# Patient Record
Sex: Female | Born: 1995 | Race: Black or African American | Hispanic: No | Marital: Single
Health system: Southern US, Community
[De-identification: ages and names within clinical notes are randomized; demographics above are authoritative.]

## PROBLEM LIST (undated history)

## (undated) DIAGNOSIS — R011 Cardiac murmur, unspecified: Secondary | ICD-10-CM

---
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
## 2014-11-18 ENCOUNTER — Emergency Department (HOSPITAL_COMMUNITY)
Admission: EM | Admit: 2014-11-18 | Discharge: 2014-11-18 | Disposition: A | Discharge status: Home / Self Care | Payer: Medicaid Other | Attending: Emergency Medicine | Admitting: Emergency Medicine

## 2014-11-18 ENCOUNTER — Encounter (HOSPITAL_COMMUNITY): Payer: Self-pay

## 2014-11-18 DIAGNOSIS — Z79899 Other long term (current) drug therapy: Secondary | ICD-10-CM | POA: Diagnosis not present

## 2014-11-18 DIAGNOSIS — N760 Acute vaginitis: Secondary | ICD-10-CM | POA: Insufficient documentation

## 2014-11-18 DIAGNOSIS — R011 Cardiac murmur, unspecified: Secondary | ICD-10-CM | POA: Insufficient documentation

## 2014-11-18 DIAGNOSIS — Z3202 Encounter for pregnancy test, result negative: Secondary | ICD-10-CM | POA: Insufficient documentation

## 2014-11-18 DIAGNOSIS — B9689 Other specified bacterial agents as the cause of diseases classified elsewhere: Secondary | ICD-10-CM

## 2014-11-18 LAB — WET PREP, GENITAL
Clue Cells Wet Prep HPF POC: NONE SEEN
Trich, Wet Prep: NONE SEEN
WBC, Wet Prep HPF POC: NONE SEEN
YEAST WET PREP: NONE SEEN

## 2014-11-18 LAB — PREGNANCY, URINE: Preg Test, Ur: NEGATIVE

## 2014-11-18 MED ORDER — METRONIDAZOLE 500 MG PO TABS: 500.0000 mg | ORAL_TABLET | Freq: Two times a day (BID) | ORAL | Status: DC

## 2014-11-18 MED ORDER — HYDROCODONE-ACETAMINOPHEN 5-325 MG PO TABS: 2.0000 | ORAL_TABLET | ORAL | Status: DC | PRN

## 2014-11-18 NOTE — ED Notes (Signed)
Pt has been having vaginal discharge and RLQ for a few months. Was seen after having bleeding with birth control but was told that they couldn't see anything on the microscope. States she has an odor down there and with her urine.

## 2014-11-18 NOTE — Discharge Instructions (Signed)
Bacterial Vaginosis Bacterial vaginosis is a vaginal infection that occurs when the normal balance of bacteria in the vagina is disrupted. It results from an overgrowth of certain bacteria. This is the most common vaginal infection in women of childbearing age. Treatment is important to prevent complications, especially in pregnant women, as it can cause a premature delivery. CAUSES  Bacterial vaginosis is caused by an increase in harmful bacteria that are normally present in smaller amounts in the vagina. Several different kinds of bacteria can cause bacterial vaginosis. However, the reason that the condition develops is not fully understood. RISK FACTORS Certain activities or behaviors can put you at an increased risk of developing bacterial vaginosis, including:  Having a new sex partner or multiple sex partners.  Douching.  Using an intrauterine device (IUD) for contraception. Women do not get bacterial vaginosis from toilet seats, bedding, swimming pools, or contact with objects around them. SIGNS AND SYMPTOMS  Some women with bacterial vaginosis have no signs or symptoms. Common symptoms include:  Grey vaginal discharge.  A fishlike odor with discharge, especially after sexual intercourse.  Itching or burning of the vagina and vulva.  Burning or pain with urination. DIAGNOSIS  Your health care provider will take a medical history and examine the vagina for signs of bacterial vaginosis. A sample of vaginal fluid may be taken. Your health care provider will look at this sample under a microscope to check for bacteria and abnormal cells. A vaginal pH test may also be done.  TREATMENT  Bacterial vaginosis may be treated with antibiotic medicines. These may be given in the form of a pill or a vaginal cream. A second round of antibiotics may be prescribed if the condition comes back after treatment.  HOME CARE INSTRUCTIONS   Only take over-the-counter or prescription medicines as  directed by your health care provider.  If antibiotic medicine was prescribed, take it as directed. Make sure you finish it even if you start to feel better.  Do not have sex until treatment is completed.  Tell all sexual partners that you have a vaginal infection. They should see their health care provider and be treated if they have problems, such as a mild rash or itching.  Practice safe sex by using condoms and only having one sex partner. SEEK MEDICAL CARE IF:   Your symptoms are not improving after 3 days of treatment.  You have increased discharge or pain.  You have a fever. MAKE SURE YOU:   Understand these instructions.  Will watch your condition.  Will get help right away if you are not doing well or get worse. FOR MORE INFORMATION  Centers for Disease Control and Prevention, Division of STD Prevention: www.cdc.gov/std American Sexual Health Association (ASHA): www.ashastd.org  Document Released: 07/19/2005 Document Revised: 05/09/2013 Document Reviewed: 02/28/2013 [employer] Patient Information 2015 [employer]. This information is not intended to replace advice given to you by your health care provider. Make sure you discuss any questions you have with your health care provider.  

## 2014-11-18 NOTE — ED Provider Notes (Signed)
CSN: [id number]     Arrival date & time 11/18/14  1155 History   First MD Initiated Contact with Patient 11/18/14 1547     Chief Complaint  Patient presents with  . Vaginal Discharge     (Consider location/radiation/quality/duration/timing/severity/associated sxs/prior Treatment) Patient is a 19 y.o. female presenting with vaginal discharge. The history is provided by the patient. No language interpreter was used.  Vaginal Discharge Quality:  White Severity:  Moderate Onset quality:  Gradual Timing:  Constant Progression:  Worsening Chronicity:  New Context: after urination   Relieved by:  Nothing Ineffective treatments:  None tried Associated symptoms: no abdominal pain   Risk factors: no new sexual partner and no PID     Past Medical History  Diagnosis Date  . Heart murmur    History reviewed. No pertinent past surgical history. No family history on file. History  Substance Use Topics  . Smoking status: Passive Smoke Exposure - Never Smoker  . Smokeless tobacco: Not on file  . Alcohol Use: No   OB History    No data available     Review of Systems  Gastrointestinal: Negative for abdominal pain.  Genitourinary: Positive for vaginal discharge and vaginal pain.  All other systems reviewed and are negative.     Allergies  Review of patient's allergies indicates no known allergies.  Home Medications   Prior to Admission medications   Medication Sig Start Date End Date Taking? Authorizing Provider  calcium carbonate (TUMS EX) 750 MG chewable tablet Chew 1 tablet by mouth daily.   Yes Historical Provider, MD  CRANBERRY PO Take 1 tablet by mouth daily.   Yes Historical Provider, MD  ferrous fumarate (HEMOCYTE - 106 MG FE) 325 (106 FE) MG TABS tablet Take 1 tablet by mouth daily.   Yes Historical Provider, MD  medroxyPROGESTERone (DEPO-PROVERA) 150 MG/ML injection Inject 150 mg into the muscle every 3 (three) months.   Yes Historical Provider, MD  Multiple  Vitamins-Minerals (WOMENS HAIR, SKIN & NAILS PO) Take 1 tablet by mouth daily.   Yes Historical Provider, MD  lidocaine (XYLOCAINE) 2 % solution Use as directed 20 mLs in the mouth or throat as needed for mouth pain. Patient not taking: Reported on 11/18/2014 09/19/13   Francee PiccoloJennifer Piepenbrink, PA-C  metroNIDAZOLE (FLAGYL) 500 MG tablet Take 1 tablet (500 mg total) by mouth 2 (two) times daily. Patient not taking: Reported on 11/18/2014 09/19/13   Victorino DikeJennifer Piepenbrink, PA-C   BP 109/56 mmHg  Pulse 65  Temp(Src) 98.8 F (37.1 C) (Oral)  Resp 18  Ht 5\' 5"  (1.651 m)  Wt 116 lb (52.617 kg)  BMI 19.30 kg/m2  SpO2 100% Physical Exam  Constitutional: She appears well-developed and well-nourished.  HENT:  Head: Normocephalic.  Right Ear: External ear normal.  Left Ear: External ear normal.  Nose: Nose normal.  Mouth/Throat: Oropharynx is clear and moist.  Eyes: Conjunctivae are normal. Pupils are equal, round, and reactive to light.  Neck: Normal range of motion. Neck supple.  Cardiovascular: Normal rate and normal heart sounds.   Pulmonary/Chest: Effort normal and breath sounds normal.  Abdominal: Soft.  Genitourinary: Vaginal discharge found.  Thick white discharge.  Vaginal odor.  Adnexa nontender,  No mass.  Cervix nontender  Musculoskeletal: Normal range of motion.  Neurological: She is alert.  Skin: Skin is warm.  Psychiatric: She has a normal mood and affect.  Nursing note and vitals reviewed.   ED Course  Procedures (including critical care time) Labs Review Labs Reviewed  WET PREP, GENITAL  PREGNANCY, URINE  GC/CHLAMYDIA PROBE AMP ([place])    Imaging Review No results found.   EKG Interpretation None      MDM urine preg is negative,    Wet prep is completely negative  (I doubt this result, diluted, mistake...)  I feel clinically pt has a vaginitis,  It does not appear to be yeast.  As pt has vaginal discomfort, odor and discharge I am going to treat for BV.     Final diagnoses:  BV (bacterial vaginosis)    Flagyl  Schedule to see your Physician for recheck     Elson Areas, PA-C 11/18/14 1729  Eber Hong, MD 11/19/14 [phone]

## 2014-11-19 LAB — GC/CHLAMYDIA PROBE AMP (~~LOC~~) NOT AT ARMC
Chlamydia: NEGATIVE
NEISSERIA GONORRHEA: NEGATIVE

## 2015-01-30 ENCOUNTER — Encounter (HOSPITAL_COMMUNITY): Payer: Self-pay | Admitting: Emergency Medicine

## 2015-01-30 ENCOUNTER — Emergency Department (INDEPENDENT_AMBULATORY_CARE_PROVIDER_SITE_OTHER)
Admission: EM | Admit: 2015-01-30 | Discharge: 2015-01-30 | Disposition: A | Discharge status: Home / Self Care | Payer: Medicaid Other | Source: Home / Self Care | Attending: Family Medicine | Admitting: Family Medicine

## 2015-01-30 DIAGNOSIS — J029 Acute pharyngitis, unspecified: Secondary | ICD-10-CM

## 2015-01-30 LAB — POCT RAPID STREP A: STREPTOCOCCUS, GROUP A SCREEN (DIRECT): NEGATIVE

## 2015-01-30 MED ORDER — NAPROXEN 375 MG PO TABS: 375.0000 mg | ORAL_TABLET | Freq: Two times a day (BID) | ORAL | Status: DC

## 2015-01-30 NOTE — Discharge Instructions (Signed)
Thank you for coming in today. °Call or go to the emergency room if you get worse, have trouble breathing, have chest pains, or palpitations.  ° °Pharyngitis °Pharyngitis is redness, pain, and swelling (inflammation) of your pharynx.  °CAUSES  °Pharyngitis is usually caused by infection. Most of the time, these infections are from viruses (viral) and are part of a cold. However, sometimes pharyngitis is caused by bacteria (bacterial). Pharyngitis can also be caused by allergies. Viral pharyngitis may be spread from person to person by coughing, sneezing, and personal items or utensils (cups, forks, spoons, toothbrushes). Bacterial pharyngitis may be spread from person to person by more intimate contact, such as kissing.  °SIGNS AND SYMPTOMS  °Symptoms of pharyngitis include:   °· Sore throat.   °· Tiredness (fatigue).   °· Low-grade fever.   °· Headache. °· Joint pain and muscle aches. °· Skin rashes. °· Swollen lymph nodes. °· Plaque-like film on throat or tonsils (often seen with bacterial pharyngitis). °DIAGNOSIS  °Your health care provider will ask you questions about your illness and your symptoms. Your medical history, along with a physical exam, is often all that is needed to diagnose pharyngitis. Sometimes, a rapid strep test is done. Other lab tests may also be done, depending on the suspected cause.  °TREATMENT  °Viral pharyngitis will usually get better in 3-4 days without the use of medicine. Bacterial pharyngitis is treated with medicines that kill germs (antibiotics).  °HOME CARE INSTRUCTIONS  °· Drink enough water and fluids to keep your urine clear or pale yellow.   °· Only take over-the-counter or prescription medicines as directed by your health care provider:   °¨ If you are prescribed antibiotics, make sure you finish them even if you start to feel better.   °¨ Do not take aspirin.   °· Get lots of rest.   °· Gargle with 8 oz of salt water (½ tsp of salt per 1 qt of water) as often as every 1-2  hours to soothe your throat.   °· Throat lozenges (if you are not at risk for choking) or sprays may be used to soothe your throat. °SEEK MEDICAL CARE IF:  °· You have large, tender lumps in your neck. °· You have a rash. °· You cough up green, yellow-brown, or bloody spit. °SEEK IMMEDIATE MEDICAL CARE IF:  °· Your neck becomes stiff. °· You drool or are unable to swallow liquids. °· You vomit or are unable to keep medicines or liquids down. °· You have severe pain that does not go away with the use of recommended medicines. °· You have trouble breathing (not caused by a stuffy nose). °MAKE SURE YOU:  °· Understand these instructions. °· Will watch your condition. °· Will get help right away if you are not doing well or get worse. °Document Released: 07/19/2005 Document Revised: 05/09/2013 Document Reviewed: 03/26/2013 °ExitCare® Patient Information ©2015 [employer]. This information is not intended to replace advice given to you by your health care provider. Make sure you discuss any questions you have with your health care provider. ° ° ° °

## 2015-01-30 NOTE — ED Notes (Signed)
C/o sore throat States she has right ear pain Used cold and hot drinks as tx

## 2015-01-30 NOTE — ED Provider Notes (Signed)
Hewitt Shortsysheanna Starace is a 19 y.o. female who presents to Urgent Care today for sore throat with right-sided ear pain present for 3 days worsening today. No fevers chills nausea vomiting or diarrhea. She has not tried any medications yet. She feels well otherwise.   Past Medical History  Diagnosis Date  . Heart murmur    History reviewed. No pertinent past surgical history. History  Substance Use Topics  . Smoking status: Passive Smoke Exposure - Never Smoker  . Smokeless tobacco: Not on file  . Alcohol Use: No   ROS as above Medications: No current facility-administered medications for this encounter.   Current Outpatient Prescriptions  Medication Sig Dispense Refill  . calcium carbonate (TUMS EX) 750 MG chewable tablet Chew 1 tablet by mouth daily.    . ferrous fumarate (HEMOCYTE - 106 MG FE) 325 (106 FE) MG TABS tablet Take 1 tablet by mouth daily.    . medroxyPROGESTERone (DEPO-PROVERA) 150 MG/ML injection Inject 150 mg into the muscle every 3 (three) months.    . Multiple Vitamins-Minerals (WOMENS HAIR, SKIN & NAILS PO) Take 1 tablet by mouth daily.    . naproxen (NAPROSYN) 375 MG tablet Take 1 tablet (375 mg total) by mouth 2 (two) times daily. 20 tablet 0   No Known Allergies   Exam:  BP 123/74 mmHg  Pulse 76  Temp(Src) 98 F (36.7 C) (Oral)  Resp 16  SpO2 100% Gen: Well NAD HEENT: EOMI,  MMM normal tympanic membranes bilaterally. Posterior fractures mildly erythematous. Cervical lymphadenopathy is present bilaterally. Lungs: Normal work of breathing. CTABL Heart: RRR no MRG Abd: NABS, Soft. Nondistended, Nontender Exts: Brisk capillary refill, warm and well perfused.   Results for orders placed or performed during the hospital encounter of 01/30/15 (from the past 24 hour(s))  POCT rapid strep A [facility]     Status: None   Collection Time: 01/30/15  6:15 PM  Result Value Ref Range   Streptococcus, Group A Screen (Direct) NEGATIVE NEGATIVE   No results  found.  Assessment and Plan: 19 y.o. female with pharyngitis likely viral. Naproxen for pain. Culture pending. Return as needed.  Discussed warning signs or symptoms. Please see discharge instructions. Patient expresses understanding.     Rodolph BongEvan S Dyland Panuco, MD 01/30/15 Rickey [id number]

## 2015-02-01 LAB — CULTURE, GROUP A STREP: STREP A CULTURE: NEGATIVE

## 2015-10-12 ENCOUNTER — Encounter (HOSPITAL_COMMUNITY): Payer: Self-pay | Admitting: Emergency Medicine

## 2015-10-12 ENCOUNTER — Other Ambulatory Visit (HOSPITAL_COMMUNITY)
Admission: RE | Admit: 2015-10-12 | Discharge: 2015-10-12 | Disposition: A | Discharge status: Home / Self Care | Payer: Medicaid Other | Source: Ambulatory Visit | Attending: Emergency Medicine | Admitting: Emergency Medicine

## 2015-10-12 ENCOUNTER — Emergency Department (INDEPENDENT_AMBULATORY_CARE_PROVIDER_SITE_OTHER)
Admission: EM | Admit: 2015-10-12 | Discharge: 2015-10-12 | Disposition: A | Discharge status: Home / Self Care | Payer: Medicaid Other | Source: Home / Self Care | Attending: Emergency Medicine | Admitting: Emergency Medicine

## 2015-10-12 DIAGNOSIS — J039 Acute tonsillitis, unspecified: Secondary | ICD-10-CM | POA: Diagnosis not present

## 2015-10-12 DIAGNOSIS — N760 Acute vaginitis: Secondary | ICD-10-CM | POA: Diagnosis present

## 2015-10-12 DIAGNOSIS — Z113 Encounter for screening for infections with a predominantly sexual mode of transmission: Secondary | ICD-10-CM | POA: Diagnosis present

## 2015-10-12 DIAGNOSIS — J029 Acute pharyngitis, unspecified: Secondary | ICD-10-CM | POA: Diagnosis not present

## 2015-10-12 DIAGNOSIS — Z8742 Personal history of other diseases of the female genital tract: Secondary | ICD-10-CM

## 2015-10-12 LAB — POCT URINALYSIS DIP (DEVICE)
BILIRUBIN URINE: NEGATIVE
Glucose, UA: NEGATIVE mg/dL
Ketones, ur: NEGATIVE mg/dL
LEUKOCYTES UA: NEGATIVE
Nitrite: NEGATIVE
Protein, ur: NEGATIVE mg/dL
Specific Gravity, Urine: 1.02 (ref 1.005–1.030)
Urobilinogen, UA: 2 mg/dL — ABNORMAL HIGH (ref 0.0–1.0)
pH: 7 (ref 5.0–8.0)

## 2015-10-12 LAB — POCT PREGNANCY, URINE: Preg Test, Ur: NEGATIVE

## 2015-10-12 LAB — POCT RAPID STREP A: STREPTOCOCCUS, GROUP A SCREEN (DIRECT): NEGATIVE

## 2015-10-12 NOTE — Discharge Instructions (Signed)
Pharyngitis Taken antihistamine daily such as Allegra, Zyrtec or Claritin. If this is insufficient and you need something stronger he may add Chlor-Trimeton 2-4 mg every 4 hours. This may cause drowsiness set you may want to limit taking this at home or before bedtime. Ibuprofen 600 mg every 6-8 hours as needed for throat pain and inflammation. Drink plenty of clear liquids and stay well-hydrated. Cepacol lozenges for sore throat discomfort Pharyngitis is redness, pain, and swelling (inflammation) of your pharynx.  CAUSES  Pharyngitis is usually caused by infection. Most of the time, these infections are from viruses (viral) and are part of a cold. However, sometimes pharyngitis is caused by bacteria (bacterial). Pharyngitis can also be caused by allergies. Viral pharyngitis may be spread from person to person by coughing, sneezing, and personal items or utensils (cups, forks, spoons, toothbrushes). Bacterial pharyngitis may be spread from person to person by more intimate contact, such as kissing.  SIGNS AND SYMPTOMS  Symptoms of pharyngitis include:   Sore throat.   Tiredness (fatigue).   Low-grade fever.   Headache.  Joint pain and muscle aches.  Skin rashes.  Swollen lymph nodes.  Plaque-like film on throat or tonsils (often seen with bacterial pharyngitis). DIAGNOSIS  Your health care provider will ask you questions about your illness and your symptoms. Your medical history, along with a physical exam, is often all that is needed to diagnose pharyngitis. Sometimes, a rapid strep test is done. Other lab tests may also be done, depending on the suspected cause.  TREATMENT  Viral pharyngitis will usually get better in 3-4 days without the use of medicine. Bacterial pharyngitis is treated with medicines that kill germs (antibiotics).  HOME CARE INSTRUCTIONS   Drink enough water and fluids to keep your urine clear or pale yellow.   Only take over-the-counter or prescription  medicines as directed by your health care provider:   If you are prescribed antibiotics, make sure you finish them even if you start to feel better.   Do not take aspirin.   Get lots of rest.   Gargle with 8 oz of salt water ( tsp of salt per 1 qt of water) as often as every 1-2 hours to soothe your throat.   Throat lozenges (if you are not at risk for choking) or sprays may be used to soothe your throat. SEEK MEDICAL CARE IF:   You have large, tender lumps in your neck.  You have a rash.  You cough up green, yellow-brown, or bloody spit. SEEK IMMEDIATE MEDICAL CARE IF:   Your neck becomes stiff.  You drool or are unable to swallow liquids.  You vomit or are unable to keep medicines or liquids down.  You have severe pain that does not go away with the use of recommended medicines.  You have trouble breathing (not caused by a stuffy nose). MAKE SURE YOU:   Understand these instructions.  Will watch your condition.  Will get help right away if you are not doing well or get worse.   This information is not intended to replace advice given to you by your health care provider. Make sure you discuss any questions you have with your health care provider.   Document Released: 07/19/2005 Document Revised: 05/09/2013 Document Reviewed: 03/26/2013 Elsevier Interactive Patient Education 2016 [employer]  Sore Throat A sore throat is pain, burning, irritation, or scratchiness of the throat. There is often pain or tenderness when swallowing or talking. A sore throat may be accompanied by other symptoms,  such as coughing, sneezing, fever, and swollen neck glands. A sore throat is often the first sign of another sickness, such as a cold, flu, strep throat, or mononucleosis (commonly known as mono). Most sore throats go away without medical treatment. CAUSES  The most common causes of a sore throat include:  A viral infection, such as a cold, flu, or mono.  A bacterial  infection, such as strep throat, tonsillitis, or whooping cough.  Seasonal allergies.  Dryness in the air.  Irritants, such as smoke or pollution.  Gastroesophageal reflux disease (GERD). HOME CARE INSTRUCTIONS   Only take over-the-counter medicines as directed by your caregiver.  Drink enough fluids to keep your urine clear or pale yellow.  Rest as needed.  Try using throat sprays, lozenges, or sucking on hard candy to ease any pain (if older than 4 years or as directed).  Sip warm liquids, such as broth, herbal tea, or warm water with honey to relieve pain temporarily. You may also eat or drink cold or frozen liquids such as frozen ice pops.  Gargle with salt water (mix 1 tsp salt with 8 oz of water).  Do not smoke and avoid secondhand smoke.  Put a cool-mist humidifier in your bedroom at night to moisten the air. You can also turn on a hot shower and sit in the bathroom with the door closed for 5-10 minutes. SEEK IMMEDIATE MEDICAL CARE IF:  You have difficulty breathing.  You are unable to swallow fluids, soft foods, or your saliva.  You have increased swelling in the throat.  Your sore throat does not get better in 7 days.  You have nausea and vomiting.  You have a fever or persistent symptoms for more than 2-3 days.  You have a fever and your symptoms suddenly get worse. MAKE SURE YOU:   Understand these instructions.  Will watch your condition.  Will get help right away if you are not doing well or get worse.   This information is not intended to replace advice given to you by your health care provider. Make sure you discuss any questions you have with your health care provider.   Document Released: 08/26/2004 Document Revised: 08/09/2014 Document Reviewed: 03/26/2012 Elsevier Interactive Patient Education 2016 Else[employer] Inc[employer]  Tonsillitis Tonsillitis is an infection of the throat. This infection causes the tonsils to become red, tender, and puffy  (swollen). Tonsils are groups of tissue at the back of your throat. If bacteria caused your infection, antibiotic medicine will be given to you. Sometimes symptoms of tonsillitis can be relieved with the use of steroid medicine. If your tonsillitis is severe and happens often, you may need to get your tonsils removed (tonsillectomy). HOME CARE   Rest and sleep often.  Drink enough fluids to keep your pee (urine) clear or pale yellow.  While your throat is sore, eat soft or liquid foods like:  Soup.  Ice cream.  Instant breakfast drinks.  Eat frozen ice pops.  Gargle with a warm or cold liquid to help soothe the throat. Gargle with a water and salt mix. Mix 1/4 teaspoon of salt and 1/4 teaspoon of baking soda in 1 cup of water.  Only take medicines as told by your doctor.  If you are given medicines (antibiotics), take them as told. Finish them even if you start to feel better. GET HELP IF:  You have large, tender lumps in your neck.  You have a rash.  You cough up green, yellow-brown, or bloody fluid.  You cannot swallow liquids or food for 24 hours.  You notice that only one of your tonsils is swollen. GET HELP RIGHT AWAY IF:   You throw up (vomit).  You have a very bad headache.  You have a stiff neck.  You have chest pain.  You have trouble breathing or swallowing.  You have bad throat pain, drooling, or your voice changes.  You have bad pain not helped by medicine.  You cannot fully open your mouth.  You have redness, puffiness, or bad pain in the neck.  You have a fever. MAKE SURE YOU:   Understand these instructions.  Will watch your condition.  Will get help right away if you are not doing well or get worse.   This information is not intended to replace advice given to you by your health care provider. Make sure you discuss any questions you have with your health care provider.   Document Released: 01/05/2008 Document Revised: 07/24/2013 Document  Reviewed: 01/05/2013 Elsevier Interactive Patient Education [employer]

## 2015-10-12 NOTE — ED Provider Notes (Signed)
CSN: [id number]     Arrival date & time 10/12/15  1620 History   First MD Initiated Contact with Patient 10/12/15 1749     No chief complaint on file.  (Consider location/radiation/quality/duration/timing/severity/associated sxs/prior Treatment) HPI Comments: 20 year old female states that she feels as though her tonsils have been swelling off and on this week. Is cared her thinking that her airway was off. She also complains of sore throat with the right side greater than the left.  Second concern is that of a vaginal discharge for 3 months.   Past Medical History  Diagnosis Date  . Heart murmur    History reviewed. No pertinent past surgical history. No family history on file. Social History  Substance Use Topics  . Smoking status: Passive Smoke Exposure - Never Smoker  . Smokeless tobacco: None  . Alcohol Use: No   OB History    No data available     Review of Systems  Constitutional: Positive for activity change. Negative for fever, chills, appetite change and fatigue.  HENT: Positive for congestion, postnasal drip, sore throat, trouble swallowing and voice change. Negative for facial swelling.   Eyes: Negative.   Respiratory: Negative.  Negative for shortness of breath.   Cardiovascular: Negative.   Gastrointestinal: Negative.   Genitourinary: Positive for vaginal discharge. Negative for dysuria, frequency, flank pain, vaginal pain, menstrual problem and pelvic pain.  Musculoskeletal: Negative.  Negative for neck pain and neck stiffness.  Skin: Negative for pallor and rash.  Neurological: Negative.   All other systems reviewed and are negative.   Allergies  Review of patient's allergies indicates no known allergies.  Home Medications   Prior to Admission medications   Medication Sig Start Date End Date Taking? Authorizing Provider  ferrous fumarate (HEMOCYTE - 106 MG FE) 325 (106 FE) MG TABS tablet Take 1 tablet by mouth daily.   Yes Historical Provider, MD   Multiple Vitamins-Minerals (WOMENS HAIR, SKIN & NAILS PO) Take 1 tablet by mouth daily.   Yes Historical Provider, MD  calcium carbonate (TUMS EX) 750 MG chewable tablet Chew 1 tablet by mouth daily.    Historical Provider, MD  medroxyPROGESTERone (DEPO-PROVERA) 150 MG/ML injection Inject 150 mg into the muscle every 3 (three) months.    Historical Provider, MD  naproxen (NAPROSYN) 375 MG tablet Take 1 tablet (375 mg total) by mouth 2 (two) times daily. 01/30/15   Rodolph BongEvan S Corey, MD   Meds Ordered and Administered this Visit  Medications - No data to display  BP 119/73 mmHg  Pulse 83  Temp(Src) 98.4 F (36.9 C) (Oral)  SpO2 98% No data found.   Physical Exam  Constitutional: She is oriented to person, place, and time. She appears well-developed and well-nourished. No distress.  HENT:  Mouth/Throat: No oropharyngeal exudate.  Bilateral TMs are normal Oropharynx with mildly enlarged tonsils, minor erythema. Not cryptic. No exudates. Airway is widely patent. Posterior pharynx with light erythema, cobblestoning and clear PND.  Eyes: Conjunctivae and EOM are normal.  Neck: Normal range of motion. Neck supple.  Cardiovascular: Normal rate, regular rhythm, normal heart sounds and intact distal pulses.   Pulmonary/Chest: Effort normal and breath sounds normal. No respiratory distress. She has no wheezes. She has no rales.  Genitourinary:  Normal external female genitalia. Vaginal walls without discharge. Cervix is midline. Os is closed. Ectocervix is smooth and pink. No lesions. No discharge or other fluid within the vaginal vault and covering the cervix. No odor.   Musculoskeletal: Normal range of  motion. She exhibits no edema.  Lymphadenopathy:    She has no cervical adenopathy.  Neurological: She is alert and oriented to person, place, and time. She exhibits normal muscle tone.  Skin: Skin is warm and dry. No rash noted.  Psychiatric: She has a normal mood and affect.  Nursing note and  vitals reviewed.   ED Course  Procedures (including critical care time)  Labs Review Labs Reviewed  POCT URINALYSIS DIP (DEVICE) - Abnormal; Notable for the following:    Hgb urine dipstick TRACE (*)    Urobilinogen, UA 2.0 (*)    All other components within normal limits  POCT RAPID STREP A  POCT PREGNANCY, URINE  CERVICOVAGINAL ANCILLARY ONLY   Results for orders placed or performed during the hospital encounter of 10/12/15  POCT urinalysis dip (device)  Result Value Ref Range   Glucose, UA NEGATIVE NEGATIVE mg/dL   Bilirubin Urine NEGATIVE NEGATIVE   Ketones, ur NEGATIVE NEGATIVE mg/dL   Specific Gravity, Urine 1.020 1.005 - 1.030   Hgb urine dipstick TRACE (A) NEGATIVE   pH 7.0 5.0 - 8.0   Protein, ur NEGATIVE NEGATIVE mg/dL   Urobilinogen, UA 2.0 (H) 0.0 - 1.0 mg/dL   Nitrite NEGATIVE NEGATIVE   Leukocytes, UA NEGATIVE NEGATIVE  POCT rapid strep A [facility])  Result Value Ref Range   Streptococcus, Group A Screen (Direct) NEGATIVE NEGATIVE  Pregnancy, urine POC  Result Value Ref Range   Preg Test, Ur NEGATIVE NEGATIVE     Imaging Review No results found.   Visual Acuity Review  Right Eye Distance:   Left Eye Distance:   Bilateral Distance:    Right Eye Near:   Left Eye Near:    Bilateral Near:         MDM   1. Pharyngitis   2. Tonsillitis   3. History of recurrent vaginal discharge    As above the airway is widely patent and the tonsils are minimally enlarged. They are not tense, cryptic or with exudate. No evidence of abscess formation. Strep test is negative. She has substantial PND and suspect this may give her a feeling of throat closing up.  Todays pelvic exam reveals no discharge or other abnormality. Will call for any positive tests in which you may need treatment. Taken antihistamine daily such as Allegra, Zyrtec or Claritin. If this is insufficient and you need something stronger he may add Chlor-Trimeton 2-4 mg every 4 hours. This  may cause drowsiness set you may want to limit taking this at home or before bedtime. Ibuprofen 600 mg every 6-8 hours as needed for throat pain and inflammation. Drink plenty of clear liquids and stay well-hydrated. Cepacol lozenges for sore throat discomfort Cervical cytology pending.  Hayden Rasmussen, NP 10/12/15 1836

## 2015-10-12 NOTE — ED Notes (Signed)
C/o ST onset x1 week associated w/odynophagia... Denies fevers, chills Also c/o intermittent vag d/c associated w/foul smell onset January A&O x4... No acute distress.

## 2015-10-13 LAB — CERVICOVAGINAL ANCILLARY ONLY
Chlamydia: NEGATIVE
NEISSERIA GONORRHEA: NEGATIVE

## 2015-10-13 LAB — CULTURE, GROUP A STREP (THRC)

## 2015-10-13 MED ORDER — AMOXICILLIN 500 MG PO CAPS: 500.0000 mg | ORAL_CAPSULE | Freq: Three times a day (TID) | ORAL | Status: DC

## 2015-10-14 LAB — CERVICOVAGINAL ANCILLARY ONLY: WET PREP (BD AFFIRM): POSITIVE — AB

## 2015-10-15 ENCOUNTER — Telehealth (HOSPITAL_COMMUNITY): Payer: Self-pay | Admitting: Emergency Medicine

## 2015-10-15 ENCOUNTER — Telehealth: Payer: Self-pay | Admitting: Internal Medicine

## 2015-10-15 DIAGNOSIS — B9689 Other specified bacterial agents as the cause of diseases classified elsewhere: Secondary | ICD-10-CM

## 2015-10-15 DIAGNOSIS — N760 Acute vaginitis: Principal | ICD-10-CM

## 2015-10-15 MED ORDER — METRONIDAZOLE 500 MG PO TABS: 500.0000 mg | ORAL_TABLET | Freq: Two times a day (BID) | ORAL | Status: DC

## 2015-10-15 NOTE — ED Notes (Signed)
Test for gardnerella (bacterial vaginosis) positive, prescription for metronidazole was sent to pharmacy of record [facility] [address]).   Throat culture was positive for strep and [facility] [address] by Dr Piedad ClimesHonig 10/13/15. Recheck for persistent symptoms.  Ria ClockLaura Giannah Zavadil MD  Eustace MooreLaura W Waldon Sheerin, MD 10/15/15 [phone]

## 2015-10-15 NOTE — ED Notes (Signed)
Called pt and notified of recent lab results from visit 3/12 Pt ID'd properly... Reports feeling better and sx have subsided  Per Dr. Dayton ScrapeMurray,  Test for gardnerella (bacterial vaginosis) positive, prescription for metronidazole was sent to pharmacy of record [facility] [address]).  Throat culture was positive for strep and [facility] [address] by Dr Piedad ClimesHonig 10/13/15. Result note copied to patient's MyChart. Recheck for persistent symptoms. Ria ClockLaura Murray MD  Pt reports she will p/u both Rx's today  Adv pt if sx are not getting better to return  Pt verb understanding

## 2016-01-26 ENCOUNTER — Encounter (HOSPITAL_COMMUNITY): Payer: Self-pay | Admitting: Emergency Medicine

## 2016-01-26 ENCOUNTER — Emergency Department (HOSPITAL_COMMUNITY)
Admission: EM | Admit: 2016-01-26 | Discharge: 2016-01-26 | Disposition: A | Discharge status: Home / Self Care | Payer: Medicaid Other | Attending: Emergency Medicine | Admitting: Emergency Medicine

## 2016-01-26 DIAGNOSIS — Z79899 Other long term (current) drug therapy: Secondary | ICD-10-CM | POA: Insufficient documentation

## 2016-01-26 DIAGNOSIS — N898 Other specified noninflammatory disorders of vagina: Secondary | ICD-10-CM

## 2016-01-26 DIAGNOSIS — Z7722 Contact with and (suspected) exposure to environmental tobacco smoke (acute) (chronic): Secondary | ICD-10-CM | POA: Insufficient documentation

## 2016-01-26 DIAGNOSIS — R319 Hematuria, unspecified: Secondary | ICD-10-CM

## 2016-01-26 DIAGNOSIS — N390 Urinary tract infection, site not specified: Secondary | ICD-10-CM

## 2016-01-26 LAB — URINE MICROSCOPIC-ADD ON

## 2016-01-26 LAB — GC/CHLAMYDIA PROBE AMP (~~LOC~~) NOT AT ARMC
CHLAMYDIA, DNA PROBE: NEGATIVE
NEISSERIA GONORRHEA: NEGATIVE

## 2016-01-26 LAB — POC URINE PREG, ED: Preg Test, Ur: NEGATIVE

## 2016-01-26 LAB — WET PREP, GENITAL
Clue Cells Wet Prep HPF POC: NONE SEEN
Sperm: NONE SEEN
TRICH WET PREP: NONE SEEN
Yeast Wet Prep HPF POC: NONE SEEN

## 2016-01-26 LAB — URINALYSIS, ROUTINE W REFLEX MICROSCOPIC
Bilirubin Urine: NEGATIVE
GLUCOSE, UA: NEGATIVE mg/dL
Ketones, ur: NEGATIVE mg/dL
Nitrite: NEGATIVE
Protein, ur: 100 mg/dL — AB
SPECIFIC GRAVITY, URINE: 1.018 (ref 1.005–1.030)
pH: 6 (ref 5.0–8.0)

## 2016-01-26 MED ORDER — CEFTRIAXONE SODIUM 250 MG IJ SOLR
250.0000 mg | Freq: Once | INTRAMUSCULAR | Status: AC
Start: 2016-01-26 — End: 2016-01-26
  Administered 2016-01-26: 250 mg via INTRAMUSCULAR
  Filled 2016-01-26: qty 250

## 2016-01-26 MED ORDER — AZITHROMYCIN 250 MG PO TABS
1000.0000 mg | ORAL_TABLET | Freq: Once | ORAL | Status: AC
  Administered 2016-01-26: 1000 mg via ORAL
  Filled 2016-01-26: qty 4

## 2016-01-26 MED ORDER — CEPHALEXIN 500 MG PO CAPS: 500.0000 mg | ORAL_CAPSULE | Freq: Four times a day (QID) | ORAL | Status: DC

## 2016-01-26 NOTE — ED Notes (Signed)
PA at bedside.

## 2016-01-26 NOTE — ED Notes (Signed)
Pt reports dysuria x 1 week with possible blood in urine beginning yesterday.  Pt reports pink vaginal discharge and some incontinence.  Pt denies fever/chills.

## 2016-01-26 NOTE — ED Notes (Signed)
Pelvic cart set up 

## 2016-01-26 NOTE — Discharge Instructions (Signed)

## 2016-01-26 NOTE — ED Provider Notes (Signed)
CSN: [id number]     Arrival date & time 01/26/16  [id number] History   First MD Initiated Contact with Patient 01/26/16 0645     Chief Complaint  Patient presents with  . Urinary Tract Infection    HPI  Lauren Cole is an 20 y.o. female with history of recurrent BV who presents to the ED for evaluation of dysuria and increased urinary urgency over the past week. She states this feels like UTIs she has had in the past but worse in severity. She states she also noticed associated hematuria for the past two days. Endorses intermittent low back pain and lower abdominal pain. Denies nausea, vomitting, diarrhea, fever, chills. She states she is sexually active with one partner but they do not use protection. She states she is on depo and had her last injection earlier this month. She states she has also noticed some pink-white vaginal discharge increased from baseline over the past week. She states she does have a history of recurrent BV infections but this discharge seems different. She declines HIV and RPR screening today.  Past Medical History  Diagnosis Date  . Heart murmur    No past surgical history on file. No family history on file. Social History  Substance Use Topics  . Smoking status: Passive Smoke Exposure - Never Smoker  . Smokeless tobacco: Not on file  . Alcohol Use: No   OB History    No data available     Review of Systems  All other systems reviewed and are negative.     Allergies  Review of patient's allergies indicates no known allergies.  Home Medications   Prior to Admission medications   Medication Sig Start Date End Date Taking? Authorizing Provider  amoxicillin (AMOXIL) 500 MG capsule Take 1 capsule (500 mg total) by mouth 3 (three) times daily. 10/13/15   Charm RingsErin J Honig, MD  calcium carbonate (TUMS EX) 750 MG chewable tablet Chew 1 tablet by mouth daily.    Historical Provider, MD  ferrous fumarate (HEMOCYTE - 106 MG FE) 325 (106 FE) MG TABS tablet Take 1  tablet by mouth daily.    Historical Provider, MD  medroxyPROGESTERone (DEPO-PROVERA) 150 MG/ML injection Inject 150 mg into the muscle every 3 (three) months.    Historical Provider, MD  metroNIDAZOLE (FLAGYL) 500 MG tablet Take 1 tablet (500 mg total) by mouth 2 (two) times daily. 10/15/15   Eustace MooreLaura W Murray, MD  Multiple Vitamins-Minerals (WOMENS HAIR, SKIN & NAILS PO) Take 1 tablet by mouth daily.    Historical Provider, MD  naproxen (NAPROSYN) 375 MG tablet Take 1 tablet (375 mg total) by mouth 2 (two) times daily. 01/30/15   Rodolph BongEvan S Corey, MD   BP 119/84 mmHg  Pulse 69  Temp(Src) 98.3 F (36.8 C) (Oral)  Resp 16  Ht 5' 4.25" (1.632 m)  Wt 52.617 kg  BMI 19.76 kg/m2  SpO2 100% Physical Exam  Constitutional: She is oriented to person, place, and time.  HENT:  Right Ear: External ear normal.  Left Ear: External ear normal.  Nose: Nose normal.  Mouth/Throat: Oropharynx is clear and moist. No oropharyngeal exudate.  Eyes: Conjunctivae and EOM are normal.  Neck: Normal range of motion. Neck supple.  Cardiovascular: Normal rate, regular rhythm, normal heart sounds and intact distal pulses.   Pulmonary/Chest: Effort normal and breath sounds normal. No respiratory distress. She has no wheezes.  Abdominal: Soft. Bowel sounds are normal. She exhibits no distension.  +suprapubic ttp without rebound or guarding  No CVA tenderness  Genitourinary:  Pelvic exam performed by Carey BullocksJennifer Taylor, PA-S with myself as chaperone. Cervix nonfriable, os closed. Moderate amount of white discharge in vault. No CMT. No adnexal tenderness.  Musculoskeletal: She exhibits no edema.  Neurological: She is alert and oriented to person, place, and time. No cranial nerve deficit.  Skin: Skin is warm and dry.  Psychiatric: She has a normal mood and affect.  Nursing note and vitals reviewed.   ED Course  Procedures (including critical care time) Labs Review Labs Reviewed  WET PREP, GENITAL - Abnormal; Notable for  the following:    WBC, Wet Prep HPF POC MANY (*)    All other components within normal limits  URINALYSIS, ROUTINE W REFLEX MICROSCOPIC (NOT AT [facility]) - Abnormal; Notable for the following:    APPearance TURBID (*)    Hgb urine dipstick MODERATE (*)    Protein, ur 100 (*)    Leukocytes, UA LARGE (*)    All other components within normal limits  URINE MICROSCOPIC-ADD ON - Abnormal; Notable for the following:    Squamous Epithelial / LPF 6-30 (*)    Bacteria, UA FEW (*)    All other components within normal limits  URINE CULTURE  POC URINE PREG, ED  GC/CHLAMYDIA PROBE AMP ([place]) NOT AT [facility]    Imaging Review No results found. I have personally reviewed and evaluated these images and lab results as part of my medical decision-making.   EKG Interpretation None      MDM   Final diagnoses:  Urinary tract infection with hematuria, site unspecified  Vaginal discharge    UA with evidence of contamination. However, she does have TNTC WBC with blood and few bacteria. Will treat as UTI given symptoms. Will send for culture. Her wet prep was negative for clue cells, trich, yeast but positive with many WBC. She is sexually active and does not use barrier protection. We will treat empirically today with rocephin and azithromycin. rx for keflex given for home. Encouraged sexual abstinence for at least one week and testing/treatment of all recent sexual partners. Encouraged PCP f/u as an outpatient. Resource guide given. ER return precautions given.    Carlene CoriaSerena Y Louis Gaw, PA-C 01/26/16 1003  Loren Raceravid Yelverton, MD 01/30/16 2201

## 2016-01-27 LAB — URINE CULTURE

## 2016-03-17 ENCOUNTER — Encounter (HOSPITAL_COMMUNITY): Payer: Self-pay

## 2016-03-17 ENCOUNTER — Emergency Department (HOSPITAL_COMMUNITY)
Admission: EM | Admit: 2016-03-17 | Discharge: 2016-03-17 | Disposition: A | Discharge status: Home / Self Care | Payer: Self-pay | Attending: Emergency Medicine | Admitting: Emergency Medicine

## 2016-03-17 ENCOUNTER — Emergency Department (HOSPITAL_COMMUNITY): Payer: Self-pay

## 2016-03-17 DIAGNOSIS — Y92410 Unspecified street and highway as the place of occurrence of the external cause: Secondary | ICD-10-CM | POA: Insufficient documentation

## 2016-03-17 DIAGNOSIS — S161XXA Strain of muscle, fascia and tendon at neck level, initial encounter: Secondary | ICD-10-CM | POA: Insufficient documentation

## 2016-03-17 DIAGNOSIS — R51 Headache: Secondary | ICD-10-CM | POA: Insufficient documentation

## 2016-03-17 DIAGNOSIS — Y939 Activity, unspecified: Secondary | ICD-10-CM | POA: Insufficient documentation

## 2016-03-17 DIAGNOSIS — Z7722 Contact with and (suspected) exposure to environmental tobacco smoke (acute) (chronic): Secondary | ICD-10-CM | POA: Insufficient documentation

## 2016-03-17 DIAGNOSIS — Y999 Unspecified external cause status: Secondary | ICD-10-CM | POA: Insufficient documentation

## 2016-03-17 MED ORDER — CYCLOBENZAPRINE HCL 10 MG PO TABS: 10.0000 mg | ORAL_TABLET | Freq: Two times a day (BID) | ORAL | 0 refills | Status: DC | PRN

## 2016-03-17 MED ORDER — NAPROXEN 500 MG PO TABS: 500.0000 mg | ORAL_TABLET | Freq: Two times a day (BID) | ORAL | 0 refills | Status: DC

## 2016-03-17 NOTE — ED Notes (Signed)
Patient Alert and oriented X4. Stable and ambulatory. Patient verbalized understanding of the discharge instructions.  Patient belongings were taken by the patient.  
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
## 2016-03-17 NOTE — ED Notes (Signed)
c-collar applied in triage
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
## 2016-03-17 NOTE — ED Notes (Signed)
Patient is A&Ox4 at this time.  Patient in no signs of distress.  Please see providers note for complete history and physical exam.  

## 2016-09-21 ENCOUNTER — Encounter (HOSPITAL_COMMUNITY): Payer: Self-pay | Admitting: Family Medicine

## 2016-09-21 ENCOUNTER — Ambulatory Visit (HOSPITAL_COMMUNITY)
Admission: EM | Admit: 2016-09-21 | Discharge: 2016-09-21 | Disposition: A | Discharge status: Home / Self Care | Payer: Medicaid Other | Attending: Family Medicine | Admitting: Family Medicine

## 2016-09-21 DIAGNOSIS — F419 Anxiety disorder, unspecified: Secondary | ICD-10-CM

## 2016-09-21 MED ORDER — HYDROXYZINE HCL 25 MG PO TABS: 25.0000 mg | ORAL_TABLET | Freq: Four times a day (QID) | ORAL | 1 refills | Status: DC | PRN

## 2016-09-21 NOTE — Discharge Instructions (Signed)
Your signs and symptoms are consistent with anxiety. I would continue to do the breathing exorcizes you have mentioned. In addition to those therapies, I have prescribed a medicine called Atarax for anxiety. This medicine is non-addictive and non habit forming. Take 1 tablet every 6 hours as needed for anxiety. Should your symptoms worsen, follow up with your primary care provider or return to clinic as needed.

## 2016-09-21 NOTE — ED Provider Notes (Signed)
CSN: [id number]     Arrival date & time 09/21/16  1519 History   First MD Initiated Contact with Patient 09/21/16 1723     Chief Complaint  Patient presents with  . Facial Pain   (Consider location/radiation/quality/duration/timing/severity/associated sxs/prior Treatment) The history is provided by the patient.  Anxiety  This is a new problem. The current episode started more than 2 days ago (after being informed of "abdnormal Pap"). The problem occurs hourly. The problem has not changed since onset.Associated symptoms include chest pain and shortness of breath. The symptoms are relieved by relaxation and walking. Treatments tried: deep breathing, rest, walking, meditation. The treatment provided mild relief.    Past Medical History:  Diagnosis Date  . Heart murmur    History reviewed. No pertinent surgical history. History reviewed. No pertinent family history. Social History  Substance Use Topics  . Smoking status: Passive Smoke Exposure - Never Smoker  . Smokeless tobacco: Never Used  . Alcohol use Yes     Comment: "occasinally"   OB History    No data available     Review of Systems  Reason unable to perform ROS: as covered in HPI.  Respiratory: Positive for shortness of breath.   Cardiovascular: Positive for chest pain.  All other systems reviewed and are negative.   Allergies  Patient has no known allergies.  Home Medications   Prior to Admission medications   Medication Sig Start Date End Date Taking? Authorizing Provider  hydrOXYzine (ATARAX/VISTARIL) 25 MG tablet Take 1 tablet (25 mg total) by mouth every 6 (six) hours as needed. 09/21/16   Dorena Bodo, NP  medroxyPROGESTERone (DEPO-PROVERA) 150 MG/ML injection Inject 150 mg into the muscle every 3 (three) months.    Historical Provider, MD   Meds Ordered and Administered this Visit  Medications - No data to display  BP 140/80   Pulse 103   Temp 98.9 F (37.2 C)   Resp 18   SpO2 100%  No data  found.   Physical Exam  Constitutional: She is oriented to person, place, and time. She appears well-developed and well-nourished. No distress.  HENT:  Head: Normocephalic and atraumatic.  Right Ear: External ear normal.  Left Ear: External ear normal.  Cardiovascular: Normal rate and regular rhythm.   Pulmonary/Chest: Effort normal and breath sounds normal.  Abdominal: Soft. Bowel sounds are normal.  Neurological: She is alert and oriented to person, place, and time.  Skin: Skin is warm and dry. Capillary refill takes less than 2 seconds. She is not diaphoretic.  Psychiatric: She has a normal mood and affect. Her behavior is normal.  Nursing note and vitals reviewed.   Urgent Care Course     Procedures (including critical care time)  Labs Review Labs Reviewed - No data to display  Imaging Review No results found.   Visual Acuity Review  Right Eye Distance:   Left Eye Distance:   Bilateral Distance:    Right Eye Near:   Left Eye Near:    Bilateral Near:         MDM   1. Anxiety   Your signs and symptoms are consistent with anxiety. I would continue to do the breathing exorcizes you have mentioned. In addition to those therapies, I have prescribed a medicine called Atarax for anxiety. This medicine is non-addictive and non habit forming. Take 1 tablet every 6 hours as needed for anxiety. Should your symptoms worsen, follow up with your primary care provider or return to clinic as  needed.      Dorena BodoLawrence Peyton Spengler, NP 09/21/16 1743

## 2016-09-21 NOTE — ED Triage Notes (Signed)
Pt here for right facial pain, burning and radiation to right shoulder and arm. sts that it all started a few days ago after she was told her pap smear was abnormal is when it started . sts she has b been shaky and having chest tightness.

## 2016-12-23 ENCOUNTER — Emergency Department (HOSPITAL_COMMUNITY)
Admission: EM | Admit: 2016-12-23 | Discharge: 2016-12-23 | Disposition: A | Discharge status: Home / Self Care | Payer: Medicaid Other | Attending: Emergency Medicine | Admitting: Emergency Medicine

## 2016-12-23 ENCOUNTER — Emergency Department (HOSPITAL_COMMUNITY): Payer: Medicaid Other

## 2016-12-23 ENCOUNTER — Encounter (HOSPITAL_COMMUNITY): Payer: Self-pay

## 2016-12-23 DIAGNOSIS — Y998 Other external cause status: Secondary | ICD-10-CM | POA: Insufficient documentation

## 2016-12-23 DIAGNOSIS — M542 Cervicalgia: Secondary | ICD-10-CM | POA: Insufficient documentation

## 2016-12-23 DIAGNOSIS — Y92410 Unspecified street and highway as the place of occurrence of the external cause: Secondary | ICD-10-CM | POA: Diagnosis not present

## 2016-12-23 DIAGNOSIS — Z7722 Contact with and (suspected) exposure to environmental tobacco smoke (acute) (chronic): Secondary | ICD-10-CM | POA: Insufficient documentation

## 2016-12-23 DIAGNOSIS — Y9389 Activity, other specified: Secondary | ICD-10-CM | POA: Insufficient documentation

## 2016-12-23 MED ORDER — IBUPROFEN 200 MG PO TABS
600.0000 mg | ORAL_TABLET | Freq: Once | ORAL | Status: AC
  Administered 2016-12-23: 600 mg via ORAL
  Filled 2016-12-23: qty 1

## 2016-12-23 MED ORDER — METHOCARBAMOL 500 MG PO TABS: 500.0000 mg | ORAL_TABLET | Freq: Two times a day (BID) | ORAL | 0 refills | Status: DC

## 2016-12-23 MED ORDER — IBUPROFEN 800 MG PO TABS: 800.0000 mg | ORAL_TABLET | Freq: Three times a day (TID) | ORAL | 0 refills | Status: DC
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
## 2016-12-23 NOTE — ED Notes (Signed)
Patient transported to X-ray 
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
## 2016-12-27 ENCOUNTER — Encounter (HOSPITAL_COMMUNITY): Payer: Self-pay | Admitting: Emergency Medicine

## 2016-12-27 ENCOUNTER — Emergency Department (HOSPITAL_COMMUNITY)
Admission: EM | Admit: 2016-12-27 | Discharge: 2016-12-28 | Disposition: A | Discharge status: Home / Self Care | Payer: Medicaid Other | Attending: Emergency Medicine | Admitting: Emergency Medicine

## 2016-12-27 DIAGNOSIS — Y929 Unspecified place or not applicable: Secondary | ICD-10-CM | POA: Insufficient documentation

## 2016-12-27 DIAGNOSIS — Z7722 Contact with and (suspected) exposure to environmental tobacco smoke (acute) (chronic): Secondary | ICD-10-CM | POA: Insufficient documentation

## 2016-12-27 DIAGNOSIS — Y939 Activity, unspecified: Secondary | ICD-10-CM | POA: Insufficient documentation

## 2016-12-27 DIAGNOSIS — M25511 Pain in right shoulder: Secondary | ICD-10-CM

## 2016-12-27 DIAGNOSIS — Y999 Unspecified external cause status: Secondary | ICD-10-CM | POA: Insufficient documentation

## 2016-12-27 NOTE — ED Notes (Signed)
PA at bedside.
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
## 2016-12-28 ENCOUNTER — Emergency Department (HOSPITAL_COMMUNITY): Payer: Medicaid Other

## 2016-12-28 NOTE — ED Notes (Signed)
Patient able to ambulate independently  

## 2016-12-28 NOTE — Discharge Instructions (Signed)
Please follow up with Dr. Magnus IvanBlackman, orthopedic surgeon, in 1-2 weeks regarding today's visit. Please take ibuprofen or naproxen as needed for pain relief. Continue good range of motion of the right shoulder.  Get help right away if: Your arm, hand, or fingers: Tingle. Become numb. Become swollen. Become painful. Turn white or blue.

## 2017-11-30 ENCOUNTER — Other Ambulatory Visit: Payer: Self-pay

## 2017-11-30 ENCOUNTER — Encounter (HOSPITAL_COMMUNITY): Payer: Self-pay

## 2017-11-30 ENCOUNTER — Emergency Department (HOSPITAL_COMMUNITY)
Admission: EM | Admit: 2017-11-30 | Discharge: 2017-11-30 | Disposition: A | Discharge status: Home / Self Care | Payer: Self-pay | Attending: Emergency Medicine | Admitting: Emergency Medicine

## 2017-11-30 DIAGNOSIS — J069 Acute upper respiratory infection, unspecified: Secondary | ICD-10-CM | POA: Insufficient documentation

## 2017-11-30 DIAGNOSIS — N760 Acute vaginitis: Secondary | ICD-10-CM | POA: Insufficient documentation

## 2017-11-30 DIAGNOSIS — B9689 Other specified bacterial agents as the cause of diseases classified elsewhere: Secondary | ICD-10-CM

## 2017-11-30 DIAGNOSIS — R05 Cough: Secondary | ICD-10-CM | POA: Insufficient documentation

## 2017-11-30 DIAGNOSIS — Z79899 Other long term (current) drug therapy: Secondary | ICD-10-CM | POA: Insufficient documentation

## 2017-11-30 DIAGNOSIS — R300 Dysuria: Secondary | ICD-10-CM | POA: Insufficient documentation

## 2017-11-30 DIAGNOSIS — Z7722 Contact with and (suspected) exposure to environmental tobacco smoke (acute) (chronic): Secondary | ICD-10-CM | POA: Insufficient documentation

## 2017-11-30 LAB — URINALYSIS, ROUTINE W REFLEX MICROSCOPIC
Bilirubin Urine: NEGATIVE
Glucose, UA: NEGATIVE mg/dL
HGB URINE DIPSTICK: NEGATIVE
Ketones, ur: NEGATIVE mg/dL
NITRITE: NEGATIVE
PROTEIN: NEGATIVE mg/dL
Specific Gravity, Urine: 1.006 (ref 1.005–1.030)
pH: 6 (ref 5.0–8.0)

## 2017-11-30 LAB — WET PREP, GENITAL
Sperm: NONE SEEN
Trich, Wet Prep: NONE SEEN
Yeast Wet Prep HPF POC: NONE SEEN

## 2017-11-30 LAB — POC URINE PREG, ED: Preg Test, Ur: NEGATIVE

## 2017-11-30 MED ORDER — ONDANSETRON 4 MG PO TBDP
4.0000 mg | ORAL_TABLET | Freq: Once | ORAL | Status: AC
Start: 2017-11-30 — End: 2017-11-30
  Administered 2017-11-30: 4 mg via ORAL
  Filled 2017-11-30: qty 1

## 2017-11-30 MED ORDER — LIDOCAINE HCL (PF) 1 % IJ SOLN
INTRAMUSCULAR | Status: AC
  Administered 2017-11-30: 0.9 mL
  Filled 2017-11-30: qty 5

## 2017-11-30 MED ORDER — METRONIDAZOLE 500 MG PO TABS: 500.0000 mg | ORAL_TABLET | Freq: Two times a day (BID) | ORAL | 0 refills | Status: DC

## 2017-11-30 MED ORDER — AZITHROMYCIN 250 MG PO TABS
1000.0000 mg | ORAL_TABLET | Freq: Once | ORAL | Status: AC
Start: 2017-11-30 — End: 2017-11-30
  Administered 2017-11-30: 1000 mg via ORAL
  Filled 2017-11-30: qty 4

## 2017-11-30 MED ORDER — CEFTRIAXONE SODIUM 250 MG IJ SOLR
250.0000 mg | Freq: Once | INTRAMUSCULAR | Status: AC
  Administered 2017-11-30: 250 mg via INTRAMUSCULAR
  Filled 2017-11-30: qty 250

## 2017-11-30 NOTE — ED Triage Notes (Signed)
Pt. Having cold symptoms, began Monday night. , sneezing, cough sore throat, body aches, fever, headache.  She also reports having intermittent dysuria, odor to her urine and frequency.  She denies d/v, pt. Report having intermittent nausea not at present.

## 2017-11-30 NOTE — ED Notes (Signed)
Pt informed she needs to provide additional urine for the urine culture. Pt verbalized understanding.

## 2017-11-30 NOTE — ED Provider Notes (Signed)
[facility] EMERGENCY DEPARTMENT Provider Note   CSN: [id number] Arrival date & time: 11/30/17  4098     History   Chief Complaint Chief Complaint  Patient presents with  . Sore Throat  . Cough  . Dysuria  . Vaginal Discharge    HPI Lauren Cole is a 22 y.o. female.  22 year old female with past medical history including BV who presents with cold symptoms and vaginal discharge.  2 days ago, she began having cough associated with sneezing, sore throat, body aches, feeling feverish, and headaches.  Sister has been sick with a cold recently and coworker has upper respiratory infection.  No chest pain or breathing problems.  She also notes that she has recently had vaginal discharge, dysuria intermittently, malodorous urine, and increased urinary frequency.  She has been taking over-the-counter medication from the [employer] store for urinary tract infection but no relief of symptoms.  No medications today prior to arrival.  She has had intermittent nausea but no vomiting or diarrhea. She is sexually active with 1 partner and uses protection most of the time.  The history is provided by the patient.  Sore Throat   Cough   Dysuria    Vaginal Discharge   Associated symptoms include dysuria.    Past Medical History:  Diagnosis Date  . Heart murmur     There are no active problems to display for this patient.   History reviewed. No pertinent surgical history.   OB History   None      Home Medications    Prior to Admission medications   Medication Sig Start Date End Date Taking? Authorizing Provider  hydrOXYzine (ATARAX/VISTARIL) 25 MG tablet Take 1 tablet (25 mg total) by mouth every 6 (six) hours as needed. 09/21/16   Dorena Bodo, NP  ibuprofen (ADVIL,MOTRIN) 800 MG tablet Take 1 tablet (800 mg total) by mouth 3 (three) times daily. 12/23/16   Felicie Morn, NP  medroxyPROGESTERone (DEPO-PROVERA) 150 MG/ML injection Inject 150 mg into the muscle  every 3 (three) months.    [provider]  methocarbamol (ROBAXIN) 500 MG tablet Take 1 tablet (500 mg total) by mouth 2 (two) times daily. 12/23/16   Felicie Morn, NP  metroNIDAZOLE (FLAGYL) 500 MG tablet Take 1 tablet (500 mg total) by mouth 2 (two) times daily. 11/30/17   Laporscha Linehan, Ambrose Finland, MD    Family History No family history on file.  Social History Social History   Tobacco Use  . Smoking status: Passive Smoke Exposure - Never Smoker  . Smokeless tobacco: Never Used  Substance Use Topics  . Alcohol use: Yes    Comment: "occasinally"  . Drug use: No     Allergies   Patient has no known allergies.   Review of Systems Review of Systems  Respiratory: Positive for cough.   Genitourinary: Positive for dysuria and vaginal discharge.   All other systems reviewed and are negative except that which was mentioned in HPI   Physical Exam Updated Vital Signs BP 103/66   Pulse 86   Temp 99.7 F (37.6 C) (Oral)   Resp 16   Ht  (1.651 m)   Wt 52.6 kg (116 lb)   LMP 11/27/2017   SpO2 100%   BMI 19.30 kg/m   Physical Exam  Constitutional: She is oriented to person, place, and time. She appears well-developed and well-nourished. No distress.  HENT:  Head: Normocephalic and atraumatic.  Mouth/Throat: Uvula is midline and oropharynx is clear and moist.  No oropharyngeal exudate.  Moist mucous membranes  Eyes: Pupils are equal, round, and reactive to light. Conjunctivae are normal.  Neck: Neck supple.  Cardiovascular: Normal rate and regular rhythm.  Murmur heard. Pulmonary/Chest: Effort normal and breath sounds normal.  Abdominal: Soft. Bowel sounds are normal. She exhibits no distension. There is no tenderness.  Genitourinary: Cervix exhibits no motion tenderness and no friability. Right adnexum displays no tenderness. Left adnexum displays no tenderness. Vaginal discharge found.  Musculoskeletal: She exhibits no edema.  Lymphadenopathy:    She has no  cervical adenopathy.  Neurological: She is alert and oriented to person, place, and time.  Fluent speech  Skin: Skin is warm and dry.  Psychiatric: She has a normal mood and affect. Judgment normal.  Nursing note and vitals reviewed.    ED Treatments / Results  Labs (all labs ordered are listed, but only abnormal results are displayed) Labs Reviewed  WET PREP, GENITAL - Abnormal; Notable for the following components:      Result Value   Clue Cells Wet Prep HPF POC PRESENT (*)    WBC, Wet Prep HPF POC MANY (*)    All other components within normal limits  URINALYSIS, ROUTINE W REFLEX MICROSCOPIC - Abnormal; Notable for the following components:   APPearance CLOUDY (*)    Leukocytes, UA MODERATE (*)    Bacteria, UA RARE (*)    All other components within normal limits  URINE CULTURE  POC URINE PREG, ED  GC/CHLAMYDIA PROBE AMP ([place]) NOT AT [facility]    EKG None  Radiology No results found.  Procedures Procedures (including critical care time)  Medications Ordered in ED Medications  ondansetron (ZOFRAN-ODT) disintegrating tablet 4 mg (4 mg Oral Given 11/30/17 1201)  azithromycin (ZITHROMAX) tablet 1,000 mg (1,000 mg Oral Given 11/30/17 1201)  cefTRIAXone (ROCEPHIN) injection 250 mg (250 mg Intramuscular Given 11/30/17 1200)  lidocaine (PF) (XYLOCAINE) 1 % injection (0.9 mLs  Given 11/30/17 1200)     Initial Impression / Assessment and Plan / ED Course  I have reviewed the triage vital signs and the nursing notes.  Pertinent labs  that were available during my care of the patient were reviewed by me and considered in my medical decision making (see chart for details).     Sx c/w viral URI. Discussed supportive measures. No hypoxia, fever, or breathing problems to warrant CXR currently. Doubt strep.   She had some vaginal d/c on exam without CMT or adnexal tenderness. UA has moderate leuks, very Manal Kreutzer WBC or bacteria thus is not highly suggestive of UTI. Added urine  culture. Wet prep w/ clue cells and bacteria. Because of dysuria, recommended CTX and azithro in addition to flagyl for BV. Discussed OBGYN f/u.  Reviewed return precautions. Final Clinical Impressions(s) / ED Diagnoses   Final diagnoses:  Viral URI  BV (bacterial vaginosis)    ED Discharge Orders        Ordered    metroNIDAZOLE (FLAGYL) 500 MG tablet  2 times daily     11/30/17 1151       Chiquitta Matty, Ambrose Finland, MD 11/30/17 1203

## 2017-12-01 LAB — GC/CHLAMYDIA PROBE AMP (~~LOC~~) NOT AT ARMC
Chlamydia: POSITIVE — AB
NEISSERIA GONORRHEA: NEGATIVE

## 2017-12-02 LAB — URINE CULTURE: SPECIAL REQUESTS: NORMAL

## 2017-12-03 ENCOUNTER — Telehealth: Payer: Self-pay

## 2017-12-03 NOTE — Telephone Encounter (Signed)
Post ED Visit - Positive Culture Follow-up: Unsuccessful Patient Follow-up  Culture assessed and recommendations reviewed by:   Enzo Bi, Pharm.D.  Celedonio Miyamoto, Pharm.D., BCPS AQ-ID  Garvin Fila, Pharm.D., BCPS  Georgina Pillion, Pharm.D., BCPS  [place], [address].D., BCPS, AAHIVP  Estella Husk, Pharm.D., BCPS, AAHIVP  Sherlynn Carbon, PharmD  Pollyann Samples, PharmD, BCPS  Positive urine culture   Patient discharged without antimicrobial prescription and treatment is now indicated  Organism is resistant to prescribed ED discharge antimicrobial  Patient with positive blood cultures   Unable to contact patient after 3 attempts, letter will be sent to address on file  Jerry Caras 12/03/2017, 11:08 AM

## 2017-12-03 NOTE — Progress Notes (Signed)
ED Antimicrobial Stewardship Positive Culture Follow Up   Lauren Cole is an 22 y.o. female who presented to [facility] on 11/30/2017 with a chief complaint of  Chief Complaint  Patient presents with  . Sore Throat  . Cough  . Dysuria  . Vaginal Discharge    Recent Results (from the past 720 hour(s))  Wet prep, genital     Status: Abnormal   Collection Time: 11/30/17 11:00 AM  Result Value Ref Range Status   Yeast Wet Prep HPF POC NONE SEEN NONE SEEN Final   Trich, Wet Prep NONE SEEN NONE SEEN Final   Clue Cells Wet Prep HPF POC PRESENT (A) NONE SEEN Final   WBC, Wet Prep HPF POC MANY (A) NONE SEEN Final   Sperm NONE SEEN  Final    Comment: Performed at [facility], [address] [place], [place] [address]  Urine culture     Status: Abnormal   Collection Time: 11/30/17 11:46 AM  Result Value Ref Range Status   Specimen Description URINE, CLEAN CATCH  Final   Special Requests   Final    Normal Performed at [facility], [address] [place], [place] [address]    Culture >=100,000 COLONIES/mL ESCHERICHIA COLI (A)  Final   Report Status 12/02/2017 FINAL  Final   Organism ID, Bacteria ESCHERICHIA COLI (A)  Final      Susceptibility   Escherichia coli - MIC*    AMPICILLIN <=2 SENSITIVE Sensitive     CEFAZOLIN <=4 SENSITIVE Sensitive     CEFTRIAXONE <=1 SENSITIVE Sensitive     CIPROFLOXACIN <=0.25 SENSITIVE Sensitive     GENTAMICIN <=1 SENSITIVE Sensitive     IMIPENEM <=0.25 SENSITIVE Sensitive     NITROFURANTOIN <=16 SENSITIVE Sensitive     TRIMETH/SULFA <=20 SENSITIVE Sensitive     AMPICILLIN/SULBACTAM <=2 SENSITIVE Sensitive     PIP/TAZO <=4 SENSITIVE Sensitive     Extended ESBL NEGATIVE Sensitive     * >=100,000 COLONIES/mL ESCHERICHIA COLI    Patient discharged originally without antimicrobial agent and treatment is now indicated  New antibiotic prescription: add bactrim ds bid x 1 week  ED Provider: Fayrene Helper, PA-C  [name] 12/03/2017, 9:47 AM Infectious Diseases Pharmacist Phone# [phone]

## 2018-04-15 ENCOUNTER — Emergency Department (HOSPITAL_COMMUNITY)
Admission: EM | Admit: 2018-04-15 | Discharge: 2018-04-16 | Disposition: A | Discharge status: Home / Self Care | Payer: Self-pay | Attending: Emergency Medicine | Admitting: Emergency Medicine

## 2018-04-15 ENCOUNTER — Encounter (HOSPITAL_COMMUNITY): Payer: Self-pay | Admitting: Emergency Medicine

## 2018-04-15 DIAGNOSIS — S91132A Puncture wound without foreign body of left great toe without damage to nail, initial encounter: Secondary | ICD-10-CM | POA: Insufficient documentation

## 2018-04-15 DIAGNOSIS — Z7722 Contact with and (suspected) exposure to environmental tobacco smoke (acute) (chronic): Secondary | ICD-10-CM | POA: Insufficient documentation

## 2018-04-15 DIAGNOSIS — W3400XA Accidental discharge from unspecified firearms or gun, initial encounter: Secondary | ICD-10-CM

## 2018-04-15 DIAGNOSIS — Y939 Activity, unspecified: Secondary | ICD-10-CM | POA: Insufficient documentation

## 2018-04-15 DIAGNOSIS — Y999 Unspecified external cause status: Secondary | ICD-10-CM | POA: Insufficient documentation

## 2018-04-15 DIAGNOSIS — Z79899 Other long term (current) drug therapy: Secondary | ICD-10-CM | POA: Insufficient documentation

## 2018-04-15 DIAGNOSIS — S91311A Laceration without foreign body, right foot, initial encounter: Secondary | ICD-10-CM | POA: Insufficient documentation

## 2018-04-15 DIAGNOSIS — Z23 Encounter for immunization: Secondary | ICD-10-CM | POA: Insufficient documentation

## 2018-04-15 DIAGNOSIS — Y92008 Other place in unspecified non-institutional (private) residence as the place of occurrence of the external cause: Secondary | ICD-10-CM | POA: Insufficient documentation
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
## 2018-04-16 ENCOUNTER — Emergency Department (HOSPITAL_COMMUNITY): Payer: Self-pay

## 2018-04-16 MED ORDER — CIPROFLOXACIN HCL 500 MG PO TABS: 500.0000 mg | ORAL_TABLET | Freq: Two times a day (BID) | ORAL | 0 refills | Status: DC

## 2018-04-16 MED ORDER — IBUPROFEN 800 MG PO TABS: 800.0000 mg | ORAL_TABLET | Freq: Three times a day (TID) | ORAL | 0 refills | Status: DC

## 2018-04-16 MED ORDER — CEPHALEXIN 500 MG PO CAPS: 500.0000 mg | ORAL_CAPSULE | Freq: Four times a day (QID) | ORAL | 0 refills | Status: DC

## 2018-04-16 MED ORDER — CEFAZOLIN SODIUM-DEXTROSE 1-4 GM/50ML-% IV SOLN
1.0000 g | Freq: Once | INTRAVENOUS | Status: AC
  Administered 2018-04-16: 1 g via INTRAVENOUS
  Filled 2018-04-16: qty 50

## 2018-04-16 MED ORDER — IBUPROFEN 800 MG PO TABS
800.0000 mg | ORAL_TABLET | Freq: Once | ORAL | Status: AC
  Administered 2018-04-16: 800 mg via ORAL
  Filled 2018-04-16: qty 1

## 2018-04-16 MED ORDER — TETANUS-DIPHTH-ACELL PERTUSSIS 5-2.5-18.5 LF-MCG/0.5 IM SUSP
0.5000 mL | Freq: Once | INTRAMUSCULAR | Status: AC
  Administered 2018-04-16: 0.5 mL via INTRAMUSCULAR
  Filled 2018-04-16: qty 0.5

## 2018-04-16 MED ORDER — OXYCODONE-ACETAMINOPHEN 5-325 MG PO TABS: 1.0000 | ORAL_TABLET | ORAL | 0 refills | Status: DC | PRN
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
## 2018-04-16 NOTE — Discharge Instructions (Signed)
Take the prescribed medication as directed. Follow-up with Dr. Magnus IvanBlackman-- call for appt. Return to the ED for new or worsening symptoms.

## 2018-04-20 ENCOUNTER — Ambulatory Visit (INDEPENDENT_AMBULATORY_CARE_PROVIDER_SITE_OTHER): Payer: Self-pay | Admitting: Physician Assistant

## 2018-04-20 ENCOUNTER — Encounter (INDEPENDENT_AMBULATORY_CARE_PROVIDER_SITE_OTHER): Payer: Self-pay | Admitting: Physician Assistant

## 2018-04-20 DIAGNOSIS — S91331D Puncture wound without foreign body, right foot, subsequent encounter: Secondary | ICD-10-CM

## 2018-04-20 DIAGNOSIS — S91139D Puncture wound without foreign body of unspecified toe(s) without damage to nail, subsequent encounter: Secondary | ICD-10-CM

## 2018-04-20 DIAGNOSIS — W3400XD Accidental discharge from unspecified firearms or gun, subsequent encounter: Secondary | ICD-10-CM
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
## 2018-04-27 ENCOUNTER — Ambulatory Visit (INDEPENDENT_AMBULATORY_CARE_PROVIDER_SITE_OTHER): Payer: Self-pay | Admitting: Physician Assistant

## 2018-04-27 DIAGNOSIS — W3400XA Accidental discharge from unspecified firearms or gun, initial encounter: Secondary | ICD-10-CM

## 2018-04-27 DIAGNOSIS — S91331A Puncture wound without foreign body, right foot, initial encounter: Secondary | ICD-10-CM | POA: Insufficient documentation

## 2018-04-27 DIAGNOSIS — W3400XD Accidental discharge from unspecified firearms or gun, subsequent encounter: Secondary | ICD-10-CM

## 2018-04-27 DIAGNOSIS — S91331D Puncture wound without foreign body, right foot, subsequent encounter: Secondary | ICD-10-CM
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
## 2018-05-11 ENCOUNTER — Ambulatory Visit (INDEPENDENT_AMBULATORY_CARE_PROVIDER_SITE_OTHER): Payer: Self-pay | Admitting: Orthopaedic Surgery

## 2018-05-11 DIAGNOSIS — W3400XD Accidental discharge from unspecified firearms or gun, subsequent encounter: Secondary | ICD-10-CM

## 2018-05-11 DIAGNOSIS — S91331D Puncture wound without foreign body, right foot, subsequent encounter: Secondary | ICD-10-CM
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
## 2018-12-26 ENCOUNTER — Other Ambulatory Visit: Payer: Self-pay

## 2018-12-26 ENCOUNTER — Ambulatory Visit (INDEPENDENT_AMBULATORY_CARE_PROVIDER_SITE_OTHER): Payer: Self-pay

## 2018-12-26 ENCOUNTER — Ambulatory Visit (INDEPENDENT_AMBULATORY_CARE_PROVIDER_SITE_OTHER): Payer: Self-pay | Admitting: Orthopaedic Surgery

## 2018-12-26 ENCOUNTER — Encounter: Payer: Self-pay | Admitting: Orthopaedic Surgery

## 2018-12-26 DIAGNOSIS — M79671 Pain in right foot: Secondary | ICD-10-CM

## 2018-12-26 DIAGNOSIS — B070 Plantar wart: Secondary | ICD-10-CM
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
## 2018-12-30 ENCOUNTER — Telehealth: Payer: Self-pay | Admitting: Internal Medicine

## 2018-12-30 DIAGNOSIS — Z20822 Contact with and (suspected) exposure to covid-19: Secondary | ICD-10-CM

## 2018-12-30 NOTE — Telephone Encounter (Signed)
Called patient to notify of possible exposure to covid-19 and to schedule an appointment. Pt agreed to be tested, at [facility] site on Monday. She is out of town right now. She is advised to wear a mask, stay in car with windows rolled up until ready to be tested.  Pt voiced understanding.

## 2019-01-01 ENCOUNTER — Other Ambulatory Visit: Payer: Self-pay

## 2019-01-01 ENCOUNTER — Telehealth: Payer: Self-pay

## 2019-01-01 ENCOUNTER — Encounter: Payer: Self-pay | Admitting: Orthopaedic Surgery

## 2019-01-01 DIAGNOSIS — Z20822 Contact with and (suspected) exposure to covid-19: Secondary | ICD-10-CM

## 2019-01-01 NOTE — Telephone Encounter (Signed)
Patient was on the 01/01/19 schedule for COVID testing. She was late for her appointment and call was made to her.  She stated she was at work and could not come today, but she could come tomorrow.  The number [phone]) was given for her to call to change her appointment.

## 2019-01-01 NOTE — Telephone Encounter (Signed)
Pt called to request documentation that details her exposure to the virus. Please advise, her job has sent her home and is requesting this document. Best Contact: [phone] Pt also stated that an email containing the document is preferred.

## 2019-01-03 ENCOUNTER — Telehealth (INDEPENDENT_AMBULATORY_CARE_PROVIDER_SITE_OTHER): Payer: Self-pay | Admitting: Orthopaedic Surgery

## 2019-01-03 NOTE — Telephone Encounter (Signed)
Called patient regarding needing proof of her exposure to covid-19 for her job. Pt stated that everything had been taken care of. She had the test done and had gotten papers she needed for her job.

## 2019-01-03 NOTE — Telephone Encounter (Signed)
Copied from CRM [id number]. Topic: Quick Communication - See Telephone Encounter >> Jan 01, 2019  1:44 PM Randol Kern wrote: CRM for notification. See Telephone encounter for: 12/30/18. See TE for 01/03/2019.
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
## 2020-08-15 ENCOUNTER — Other Ambulatory Visit: Payer: Medicaid Other

## 2020-08-15 ENCOUNTER — Other Ambulatory Visit: Payer: Self-pay

## 2020-08-15 DIAGNOSIS — Z20822 Contact with and (suspected) exposure to covid-19: Secondary | ICD-10-CM

## 2020-08-17 LAB — SARS-COV-2, NAA 2 DAY TAT

## 2020-08-17 LAB — NOVEL CORONAVIRUS, NAA: SARS-CoV-2, NAA: NOT DETECTED

## 2020-08-22 ENCOUNTER — Other Ambulatory Visit: Payer: Medicaid Other

## 2020-08-22 DIAGNOSIS — Z20822 Contact with and (suspected) exposure to covid-19: Secondary | ICD-10-CM

## 2020-08-23 LAB — NOVEL CORONAVIRUS, NAA: SARS-CoV-2, NAA: NOT DETECTED

## 2020-08-23 LAB — SARS-COV-2, NAA 2 DAY TAT

## 2020-08-29 ENCOUNTER — Other Ambulatory Visit: Payer: Medicaid Other

## 2020-08-29 DIAGNOSIS — Z20822 Contact with and (suspected) exposure to covid-19: Secondary | ICD-10-CM

## 2020-08-30 LAB — SARS-COV-2, NAA 2 DAY TAT

## 2020-08-30 LAB — NOVEL CORONAVIRUS, NAA: SARS-CoV-2, NAA: NOT DETECTED

## 2020-09-05 ENCOUNTER — Other Ambulatory Visit: Payer: Medicaid Other

## 2020-09-05 DIAGNOSIS — Z20822 Contact with and (suspected) exposure to covid-19: Secondary | ICD-10-CM

## 2020-09-06 LAB — NOVEL CORONAVIRUS, NAA: SARS-CoV-2, NAA: NOT DETECTED

## 2020-09-06 LAB — SARS-COV-2, NAA 2 DAY TAT

## 2021-11-04 ENCOUNTER — Encounter (HOSPITAL_COMMUNITY): Payer: Self-pay

## 2021-11-04 ENCOUNTER — Ambulatory Visit (HOSPITAL_COMMUNITY)
Admission: EM | Admit: 2021-11-04 | Discharge: 2021-11-04 | Disposition: A | Discharge status: Home / Self Care | Payer: Medicaid Other | Attending: Physician Assistant | Admitting: Physician Assistant

## 2021-11-04 DIAGNOSIS — J020 Streptococcal pharyngitis: Secondary | ICD-10-CM

## 2021-11-04 LAB — POCT RAPID STREP A, ED / UC: Streptococcus, Group A Screen (Direct): POSITIVE — AB

## 2021-11-04 LAB — POC INFLUENZA A AND B ANTIGEN (URGENT CARE ONLY)
INFLUENZA A ANTIGEN, POC: NEGATIVE
INFLUENZA B ANTIGEN, POC: NEGATIVE

## 2021-11-04 MED ORDER — AMOXICILLIN 500 MG PO CAPS: 500.0000 mg | ORAL_CAPSULE | Freq: Two times a day (BID) | ORAL | 0 refills | Status: AC

## 2021-11-04 NOTE — ED Triage Notes (Signed)
Pt presents today with sore throat, body aches, chills, and fever. Pt has taken Dayquil to alleviate symptoms. ?

## 2021-11-04 NOTE — Discharge Instructions (Addendum)
You tested positive for strep pharyngitis.  Please take amoxicillin 500 mg twice daily for 10 days.  Alternate Tylenol and ibuprofen for pain.  Gargle with warm salt water.  Replace your toothbrush 24 hours after starting medication to prevent reinfection.  You are contagious for 24 hours so please avoid close contact with others.  If you develop any worsening symptoms including high fever, difficulty speaking, difficulty swallowing, swelling of your throat you need to go to the emergency room. ?
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
## 2023-01-31 ENCOUNTER — Ambulatory Visit: Payer: Medicaid Other | Admitting: Student

## 2023-01-31 ENCOUNTER — Other Ambulatory Visit (HOSPITAL_COMMUNITY)
Admission: RE | Admit: 2023-01-31 | Discharge: 2023-01-31 | Disposition: A | Discharge status: Home / Self Care | Payer: Medicaid Other | Source: Ambulatory Visit | Attending: Student | Admitting: Student

## 2023-01-31 ENCOUNTER — Encounter: Payer: Self-pay | Admitting: Student

## 2023-01-31 VITALS — BP 100/67 | HR 70 | Ht 65.0 in | Wt 113.4 lb

## 2023-01-31 DIAGNOSIS — N898 Other specified noninflammatory disorders of vagina: Secondary | ICD-10-CM | POA: Insufficient documentation

## 2023-01-31 DIAGNOSIS — N760 Acute vaginitis: Secondary | ICD-10-CM | POA: Diagnosis not present

## 2023-01-31 DIAGNOSIS — Z7689 Persons encountering health services in other specified circumstances: Secondary | ICD-10-CM | POA: Diagnosis not present

## 2023-01-31 DIAGNOSIS — Z113 Encounter for screening for infections with a predominantly sexual mode of transmission: Secondary | ICD-10-CM | POA: Diagnosis not present

## 2023-01-31 DIAGNOSIS — B9689 Other specified bacterial agents as the cause of diseases classified elsewhere: Secondary | ICD-10-CM

## 2023-01-31 NOTE — Progress Notes (Unsigned)
History:  Ms. Callianne Deniston is a 27 y.o. G0P0000 who presents to clinic today for reoccurring Bacteria vaginosis.     Patient states she has experienced recurrent BV since age 11. Has sought care at the health department and 5-6 times a year is receiving pharmacological treatment. Patient has difficult time tolerating PO Flagyl and gets frequent yeast infections after vaginal gel treatment. Only has one sexual partner and both are performing hygiene surrounding sexual activity. Denies any vaginal bleeding , abnormal periods, or dysuria.    The following portions of the patient's history were reviewed and updated as appropriate: allergies, current medications, family history, past medical history, social history, past surgical history and problem list.  Review of Systems:  Review of Systems  Genitourinary:  Negative for dysuria, frequency and hematuria.  All other systems reviewed and are negative.     Objective:  Physical Exam BP 100/67   Pulse 70   Ht 5\' 5"  (1.651 m)   Wt 113 lb 6.4 oz (51.4 kg)   LMP 01/28/2023   BMI 18.87 kg/m  Physical Exam Vitals and nursing note reviewed.  Constitutional:      Appearance: Normal appearance.  Cardiovascular:     Rate and Rhythm: Normal rate.  Pulmonary:     Effort: Pulmonary effort is normal.  Genitourinary:    Comments: Patient performed self-swab Musculoskeletal:        General: Normal range of motion.  Skin:    General: Skin is warm and dry.  Neurological:     General: No focal deficit present.     Mental Status: She is alert. Mental status is at baseline.  Psychiatric:        Mood and Affect: Mood normal.        Thought Content: Thought content normal.        Judgment: Judgment normal.       Labs and Imaging No results found for this or any previous visit (from the past 24 hour(s)).  No results found.  Health Maintenance Due  Topic Date Due   COVID-19 Vaccine (1) Never done   PAP-Cervical Cytology Screening   Never done   PAP SMEAR-Modifier  Never done    Labs, imaging and previous visits in Epic and Care Everywhere reviewed  Assessment & Plan:  1. Bacterial vaginitis - Encouraged continued hygiene practices. Continue to avoid scented washes and use warm water for cleansing. Encouraged patient to also remind partner of her concerns and ensure proper measures are being taken.   - fluconazole (DIFLUCAN) 150 MG tablet; Take 1 tablet (150 mg total) by mouth daily.  Dispense: 1 tablet; Refill: 2 - clindamycin (CLEOCIN) 300 MG capsule; Take 1 capsule (300 mg total) by mouth 2 (two) times daily. For seven days  Dispense: 14 capsule; Refill: 0 - Boric Acid Vaginal 600 MG SUPP; Place 1 suppository vaginally at bedtime. Start suppositories on the same day as oral antibiotic and continue for 30 days  Dispense: 30 suppository; Refill: 0  2. Bacterial vaginal infection   3. Screening examination for STI - Cervicovaginal ancillary only( [place]) - HIV antibody (with reflex) - RPR - Hepatitis B Surface AntiGEN - Hepatitis C Antibody  4. Vaginal irritation - Counseled to take fluconazole as needed until clindamycin treatment is completed - Cervicovaginal ancillary only( [place]) - HIV antibody (with reflex) - fluconazole (DIFLUCAN) 150 MG tablet; Take 1 tablet (150 mg total) by mouth daily.  Dispense: 1 tablet; Refill: 2 - clindamycin (CLEOCIN) 300 MG capsule;  Take 1 capsule (300 mg total) by mouth 2 (two) times daily. For seven days  Dispense: 14 capsule; Refill: 0 - Boric Acid Vaginal 600 MG SUPP; Place 1 suppository vaginally at bedtime. Start suppositories on the same day as oral antibiotic and continue for 30 days  Dispense: 30 suppository; Refill: 0  5. Encounter to establish care - Plan to follow-up in 3-4 weeks   Approximately 20 minutes of total time was spent with this patient on counseling and coordination of care.   Return in about 3 weeks (around 02/21/2023) for  IN-PERSON.  Corlis Hove, NP 02/01/2023 6:18 PM

## 2023-01-31 NOTE — Progress Notes (Unsigned)
Pt reports recurring BV and yeast since the age of 61.  Last PAP 09/2022 Pt has tried Metrogel and metronidazole Pt has concerns about fertility Pt requesting STD testing.

## 2023-02-01 LAB — CERVICOVAGINAL ANCILLARY ONLY
Bacterial Vaginitis (gardnerella): POSITIVE — AB
Candida Glabrata: NEGATIVE
Candida Vaginitis: NEGATIVE
Chlamydia: NEGATIVE
Comment: NEGATIVE
Comment: NEGATIVE
Comment: NEGATIVE
Comment: NEGATIVE
Comment: NEGATIVE
Comment: NORMAL
Neisseria Gonorrhea: NEGATIVE
Trichomonas: NEGATIVE

## 2023-02-01 LAB — SYPHILIS: RPR W/REFLEX TO RPR TITER AND TREPONEMAL ANTIBODIES, TRADITIONAL SCREENING AND DIAGNOSIS ALGORITHM: RPR Ser Ql: NONREACTIVE

## 2023-02-01 LAB — HEPATITIS B SURFACE ANTIGEN: Hepatitis B Surface Ag: NEGATIVE

## 2023-02-01 LAB — HEPATITIS C ANTIBODY: Hep C Virus Ab: NONREACTIVE

## 2023-02-01 LAB — HIV ANTIBODY (ROUTINE TESTING W REFLEX): HIV Screen 4th Generation wRfx: NONREACTIVE

## 2023-02-01 MED ORDER — BORIC ACID VAGINAL 600 MG VA SUPP
1.0000 | Freq: Every evening | VAGINAL | 0 refills | Status: AC
Start: 2023-02-01 — End: ?

## 2023-02-01 MED ORDER — FLUCONAZOLE 150 MG PO TABS: 150.0000 mg | ORAL_TABLET | Freq: Every day | ORAL | 2 refills | Status: AC

## 2023-02-01 MED ORDER — CLINDAMYCIN HCL 300 MG PO CAPS: 300.0000 mg | ORAL_CAPSULE | Freq: Two times a day (BID) | ORAL | 0 refills | Status: DC

## 2023-02-23 ENCOUNTER — Telehealth (INDEPENDENT_AMBULATORY_CARE_PROVIDER_SITE_OTHER): Payer: Medicaid Other | Admitting: Obstetrics and Gynecology

## 2023-02-23 DIAGNOSIS — N760 Acute vaginitis: Secondary | ICD-10-CM

## 2023-02-23 DIAGNOSIS — Z8742 Personal history of other diseases of the female genital tract: Secondary | ICD-10-CM

## 2023-02-23 DIAGNOSIS — B9689 Other specified bacterial agents as the cause of diseases classified elsewhere: Secondary | ICD-10-CM

## 2023-02-23 MED ORDER — METRONIDAZOLE 0.75 % VA GEL
VAGINAL | 11 refills | Status: DC
Start: 2023-02-23 — End: 2023-05-14

## 2023-02-23 NOTE — Progress Notes (Signed)
   I connected with Lauren Cole 09/27/22 at  1:30 PM EST by: MyChart video and verified that I am speaking with the correct person using two identifiers.  Patient is located at home and provider is located at [facility].     I discussed the limitations, risks, security and privacy concerns of performing an evaluation and management service by MyChart video and the availability of in person appointments. I also discussed with the patient that there may be a patient responsible charge related to this service. By engaging in this virtual visit, you consent to the provision of healthcare.  Additionally, you authorize for your insurance to be billed for the services provided during this visit.  The patient expressed understanding and agreed to proceed.   The following staff members participated in the virtual visit:        GYNECOLOGY PROGRESS NOTE  History:  27 y.o. G0P0000 presents to [facility]  office today for problem gyn visit. She reports *recurrent bacterial vaginosis. Reports at least 5 occurrences per year. Has tried lifestyle changes at home. Everytime she uses metronidazole it goes away, but always comes back. Was treated this last time with clindamycin and given boric acid supp.   She denies h/a, dizziness, shortness of breath, n/v, or fever/chills.    The following portions of the patient's history were reviewed and updated as appropriate: allergies, current medications, past family history, past medical history, past social history, past surgical history and problem list.  Health Maintenance Due  Topic Date Due   PAP-Cervical Cytology Screening  Never done   PAP SMEAR-Modifier  Never done   COVID-19 Vaccine (1 - 2023-24 season) Never done     Review of Systems:  Pertinent items are noted in HPI.   Objective:  Physical Exam Last menstrual period 01/28/2023. VS reviewed, nursing note reviewed,  Constitutional: well developed, well nourished, no distress Neuro: alert and  oriented Psych: affect normal  Assessment & Plan:  1. Bacterial vaginosis 2. History of recurrent vaginal discharge Reinforced vaginal hygiene as well as voiding after sex, encouraged boric acid suppositories as well as probiotic. Discussed staying away form scented soaps, douches and body washes that disrupt the ph balance such as vagisil.   Discussed trial of weekly metrogel for suppression, pt agreed Encouraged to follow up in 4-6 months   - metroNIDAZOLE (METROGEL) 0.75 % vaginal gel; Intravaginally twice weekly for 4-6 months  Dispense: 50 g; Refill: 11  Return in 4-6 months or sooner if symptoms worsen  Albertine Grates, FNP

## 2023-03-09 DIAGNOSIS — H9201 Otalgia, right ear: Secondary | ICD-10-CM | POA: Diagnosis present

## 2023-03-09 DIAGNOSIS — J069 Acute upper respiratory infection, unspecified: Secondary | ICD-10-CM | POA: Diagnosis not present

## 2023-03-10 ENCOUNTER — Emergency Department (HOSPITAL_COMMUNITY)
Admission: EM | Admit: 2023-03-10 | Discharge: 2023-03-10 | Disposition: A | Discharge status: Home / Self Care | Payer: Medicaid Other | Attending: Emergency Medicine | Admitting: Emergency Medicine

## 2023-03-10 ENCOUNTER — Encounter (HOSPITAL_COMMUNITY): Payer: Self-pay

## 2023-03-10 DIAGNOSIS — J069 Acute upper respiratory infection, unspecified: Secondary | ICD-10-CM

## 2023-03-10 MED ORDER — AMOXICILLIN 500 MG PO CAPS: 1000.0000 mg | ORAL_CAPSULE | Freq: Two times a day (BID) | ORAL | 0 refills | Status: DC

## 2023-03-10 NOTE — ED Triage Notes (Signed)
Pt states she had ear pain that started 2 to 3 days ago after dog licked her ear. Also reports along with the ear pain she has had throat pain that is alleviated with motrin and a headache that also responds with motrin. No reported fevers and no other concerned noted. With the ear pain she states it is sensitive to air and have a tissue plugging the ear canal at the moment.

## 2023-03-10 NOTE — Discharge Instructions (Signed)
Take tylenol 2 pills 4 times a day and motrin 4 pills 3 times a day.  Drink plenty of fluids.  Return for worsening shortness of breath, headache, confusion. Follow up with your family doctor.   

## 2023-03-10 NOTE — ED Provider Notes (Signed)
[place] EMERGENCY DEPARTMENT AT [facility] Provider Note   CSN: [id number] Arrival date & time: 03/09/23  2359     History  Chief Complaint  Patient presents with   Otalgia    Lauren Cole is a 27 y.o. female.  27 yo F with a chief complaints of right ear pain.  Is developed a bit of a sore throat and a headache now.  No known sick contacts.  Denies cough or congestion.  No fevers.   Otalgia      Home Medications Prior to Admission medications   Medication Sig Start Date End Date Taking? Authorizing Provider  amoxicillin (AMOXIL) 500 MG capsule Take 2 capsules (1,000 mg total) by mouth 2 (two) times daily. 03/10/23  Yes Melene Plan, DO  Boric Acid Vaginal 600 MG SUPP Place 1 suppository vaginally at bedtime. Start suppositories on the same day as oral antibiotic and continue for 30 days 02/01/23   Corlis Hove, NP  clindamycin (CLEOCIN) 300 MG capsule Take 1 capsule (300 mg total) by mouth 2 (two) times daily. For seven days 02/01/23   Corlis Hove, NP  fluconazole (DIFLUCAN) 150 MG tablet Take 1 tablet (150 mg total) by mouth daily. 02/01/23   Corlis Hove, NP  metroNIDAZOLE (METROGEL) 0.75 % vaginal gel Intravaginally twice weekly for 4-6 months 02/23/23   Sue Lush, FNP      Allergies    Patient has no known allergies.    Review of Systems   Review of Systems  HENT:  Positive for ear pain.     Physical Exam Updated Vital Signs BP 111/72   Pulse 66   Temp 98.6 F (37 C) (Oral)   Resp 16   SpO2 100%  Physical Exam Vitals and nursing note reviewed.  Constitutional:      General: She is not in acute distress.    Appearance: She is well-developed. She is not diaphoretic.  HENT:     Head: Normocephalic and atraumatic.     Comments: Swollen turbinates, posterior nasal drip, mild tonsillar swelling bilaterally with exudates worse on the right than the left tm with a small serous effusion on the right TM normal on the left  Eyes:      Pupils: Pupils are equal, round, and reactive to light.  Cardiovascular:     Rate and Rhythm: Normal rate and regular rhythm.     Heart sounds: No murmur heard.    No friction rub. No gallop.  Pulmonary:     Effort: Pulmonary effort is normal.     Breath sounds: No wheezing or rales.  Abdominal:     General: There is no distension.     Palpations: Abdomen is soft.     Tenderness: There is no abdominal tenderness.  Musculoskeletal:        General: No tenderness.     Cervical back: Normal range of motion and neck supple.  Skin:    General: Skin is warm and dry.  Neurological:     Mental Status: She is alert and oriented to person, place, and time.  Psychiatric:        Behavior: Behavior normal.     ED Results / Procedures / Treatments   Labs (all labs ordered are listed, but only abnormal results are displayed) Labs Reviewed - No data to display  EKG None  Radiology No results found.  Procedures Procedures    Medications Ordered in ED Medications - No data to display  ED Course/ Medical Decision  Making/ A&P                                 Medical Decision Making Risk Prescription drug management.   27 yo F with a chief complaints of sore throat and right ear pain.  Clinically the patient has strep pharyngitis with the absence of cough anterior cervical lymphadenopathy and right-sided tonsillar swelling with exudates.  Will start on oral antibiotics.  Have her follow-up with her family doctor in the office.  12:24 AM:  I have discussed the diagnosis/risks/treatment options with the patient.  Evaluation and diagnostic testing in the emergency department does not suggest an emergent condition requiring admission or immediate intervention beyond what has been performed at this time.  They will follow up with PCP. We also discussed returning to the ED immediately if new or worsening sx occur. We discussed the sx which are most concerning (e.g., sudden worsening pain,  fever, inability to tolerate by mouth) that necessitate immediate return. Medications administered to the patient during their visit and any new prescriptions provided to the patient are listed below.  Medications given during this visit Medications - No data to display   The patient appears reasonably screen and/or stabilized for discharge and I doubt any other medical condition or other [facility] requiring further screening, evaluation, or treatment in the ED at this time prior to discharge.          Final Clinical Impression(s) / ED Diagnoses Final diagnoses:  Viral upper respiratory tract infection    Rx / DC Orders ED Discharge Orders          Ordered    amoxicillin (AMOXIL) 500 MG capsule  2 times daily        03/10/23 0022              Melene Plan, DO 03/10/23 0024

## 2023-05-14 ENCOUNTER — Ambulatory Visit (HOSPITAL_COMMUNITY)
Admission: EM | Admit: 2023-05-14 | Discharge: 2023-05-14 | Disposition: A | Discharge status: Home / Self Care | Payer: Medicaid Other | Attending: Family Medicine | Admitting: Family Medicine

## 2023-05-14 ENCOUNTER — Encounter (HOSPITAL_COMMUNITY): Payer: Self-pay

## 2023-05-14 ENCOUNTER — Ambulatory Visit (INDEPENDENT_AMBULATORY_CARE_PROVIDER_SITE_OTHER): Payer: Medicaid Other

## 2023-05-14 DIAGNOSIS — Z1152 Encounter for screening for COVID-19: Secondary | ICD-10-CM | POA: Insufficient documentation

## 2023-05-14 DIAGNOSIS — B9789 Other viral agents as the cause of diseases classified elsewhere: Secondary | ICD-10-CM | POA: Diagnosis not present

## 2023-05-14 DIAGNOSIS — R531 Weakness: Secondary | ICD-10-CM

## 2023-05-14 DIAGNOSIS — J069 Acute upper respiratory infection, unspecified: Secondary | ICD-10-CM

## 2023-05-14 DIAGNOSIS — R059 Cough, unspecified: Secondary | ICD-10-CM

## 2023-05-14 LAB — POCT INFLUENZA A/B
Influenza A, POC: NEGATIVE
Influenza B, POC: NEGATIVE

## 2023-05-14 MED ORDER — BENZONATATE 100 MG PO CAPS: 100.0000 mg | ORAL_CAPSULE | Freq: Three times a day (TID) | ORAL | 0 refills | Status: AC | PRN
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
## 2023-05-14 NOTE — ED Triage Notes (Signed)
Patient here today with c/o runny nose, headache, chills, and body aches X 3 days. Patient has been taking Theraflu, Alka Seltzer, cough drops, and hot tea with no relief. No sick contacts. No recent travel.

## 2023-05-14 NOTE — Discharge Instructions (Addendum)
By my review, there is no pneumonia or fluid on your chest x-ray.  The radiologist will also read your x-ray, and if their interpretation differs significantly from mine, we will call you.  Your flu test is negative  You have been swabbed for COVID, and the test will result in the next 24 hours. Our staff will call you if positive. If the COVID test is positive, you should quarantine until you are fever free for 24 hours and you are starting to feel better, and then take added precautions for the next 5 days, such as physical distancing/wearing a mask and good hand hygiene/washing.  Take benzonatate 100 mg, 1 tab every 8 hours as needed for cough.  Make sure you are getting enough fluids in.

## 2023-05-15 LAB — SARS CORONAVIRUS 2 (TAT 6-24 HRS): SARS Coronavirus 2: NEGATIVE

## 2023-11-23 DIAGNOSIS — E0590 Thyrotoxicosis, unspecified without thyrotoxic crisis or storm: Secondary | ICD-10-CM | POA: Insufficient documentation

## 2023-12-08 ENCOUNTER — Telehealth: Admitting: Nurse Practitioner

## 2023-12-08 VITALS — BP 118/76 | HR 65 | Resp 16 | Ht 65.0 in | Wt 119.0 lb

## 2023-12-08 DIAGNOSIS — J029 Acute pharyngitis, unspecified: Secondary | ICD-10-CM | POA: Diagnosis not present

## 2023-12-08 LAB — POCT RAPID STREP A (OFFICE): Rapid Strep A Screen: NEGATIVE

## 2023-12-08 NOTE — Progress Notes (Signed)
 Pt presents for sore throat -started 1 day ago  -productive cough w/phelgm-green

## 2023-12-08 NOTE — Progress Notes (Signed)
 Acute Office Visit  Virtual Visit Consent:   Lauren Cole, you are scheduled for a virtual visit with a [facility] provider today.     Just as with appointments in the office, your consent must be obtained to participate.  Your consent will be active for this visit and any virtual visit you may have with one of our providers in the next 365 days.     If you have a MyChart account, a copy of this consent can be sent to you electronically.  All virtual visits are billed to your insurance company just like a traditional visit in the office.    If the connection with a video visit is poor, the visit may have to be switched to a telephone visit.  With either a video or telephone visit, we are not always able to ensure that we have a secure connection.     I need to obtain your verbal consent now.   Are you willing to proceed with your visit today?    Lauren Cole has provided verbal consent on 12/08/2023 for a virtual visit (video or telephone).   Lauren Shake, FNP  Date: 12/08/2023 11:53 AM  Subjective:     Patient ID: Lauren Cole, female    DOB: 22-Mar-1996, 28 y.o.   MRN: [id number]  Lauren Cole, connected with  Lauren Cole  ([id number], [birth year]) on 12/08/23 at 11:00 AM EDT by a video-enabled telemedicine application and verified that I am speaking with the correct person using two identifiers.  Lauren Cole , present for entirety of visit to assist with video functionality and physical examination via [employer] device.  Location: Patient: Lauren Cole Provider: Virtual Visit Location Provider: Home   I discussed the limitations of evaluation and management by telemedicine and the availability of in person appointments. The patient expressed understanding and agreed to proceed.    Chief Complaint  Patient presents with   Sore Throat    HPI  Lauren Cole is a 28 y.o. who identifies as a female who was assigned female at birth, and  is being seen today for sore throat and cough.  She started to feel sick yesterday  Sore throat started after nap yesterday   Slight headache yesterday  Denies runny nose or congestion   Cough has been productive   Denies any problems with allergies in the past   Denies a history of asthma    Review of Systems  Constitutional:  Negative for chills and fever.  HENT: Negative.    Respiratory:  Positive for cough. Negative for wheezing.   Musculoskeletal: Negative.         Objective:    BP 118/76 (BP Location: Left Arm, Patient Position: Sitting, Cuff Size: Normal)   Pulse 65   Resp 16   Ht 5\' 5"  (1.651 m)   Wt 119 lb (54 kg)   BMI 19.80 kg/m    Physical Exam Constitutional:      General: She is not in acute distress.    Appearance: She is well-developed. She is not ill-appearing.  HENT:     Head: Normocephalic.     Nose: No congestion.  Pulmonary:     Effort: Pulmonary effort is normal.     Breath sounds: Normal breath sounds. No wheezing or rhonchi.  Musculoskeletal:     Cervical back: Normal range of motion.  Skin:    General: Skin is warm.  Neurological:     Mental Status: She is alert.    No  results found for any visits on 12/08/23.      Assessment & Plan:   Recent Results (from the past 2160 hours)  POCT rapid strep A     Status: None   Collection Time: 12/08/23 12:08 PM  Result Value Ref Range   Rapid Strep A Screen Negative Negative     Discussed viral support with OTC cough medicine (Mucinex)  Anti inflammatory medicine like ibuprofen    Push fluids increase vitamin support (Vitamin C)   Return to office if cough persists      Follow Up Instructions: I discussed the assessment and treatment plan with the patient. The patient was provided an opportunity to ask questions and all were answered. The patient agreed with the plan and demonstrated an understanding of the instructions.  A copy of instructions were sent to the patient via  MyChart unless otherwise noted below.    The patient was advised to call back or seek an in-person evaluation if the symptoms worsen or if the condition fails to improve as anticipated.    Lauren Shake, FNP  **Disclaimer: This note may have been dictated with voice recognition software. Similar sounding words can inadvertently be transcribed and this note may contain transcription errors which may not have been corrected upon publication of note.**

## 2024-06-21 ENCOUNTER — Other Ambulatory Visit: Payer: Self-pay | Admitting: Nurse Practitioner

## 2024-06-21 DIAGNOSIS — R112 Nausea with vomiting, unspecified: Secondary | ICD-10-CM

## 2024-06-21 MED ORDER — ONDANSETRON 4 MG PO TBDP
4.0000 mg | ORAL_TABLET | Freq: Three times a day (TID) | ORAL | 0 refills | Status: AC | PRN
Start: 2024-06-21 — End: ?

## 2024-07-31 NOTE — Progress Notes (Signed)
 Update to 5in5. [employer] employee do not add to registry
